# Patient Record
Sex: Female | Born: 2000 | Race: Black or African American | Hispanic: No | Marital: Single | Smoking: Former smoker
Health system: Southern US, Community
[De-identification: ages and names within clinical notes are randomized; demographics above are authoritative.]

## PROBLEM LIST (undated history)

## (undated) DIAGNOSIS — M329 Systemic lupus erythematosus, unspecified: Secondary | ICD-10-CM

## (undated) DIAGNOSIS — R110 Nausea: Secondary | ICD-10-CM

## (undated) DIAGNOSIS — IMO0002 Reserved for concepts with insufficient information to code with codable children: Secondary | ICD-10-CM

## (undated) DIAGNOSIS — M359 Systemic involvement of connective tissue, unspecified: Secondary | ICD-10-CM

## (undated) DIAGNOSIS — N289 Disorder of kidney and ureter, unspecified: Secondary | ICD-10-CM

## (undated) DIAGNOSIS — I10 Essential (primary) hypertension: Secondary | ICD-10-CM

## (undated) HISTORY — DX: Disorder of kidney and ureter, unspecified: N28.9

## (undated) HISTORY — DX: Reserved for concepts with insufficient information to code with codable children: IMO0002

## (undated) HISTORY — PX: RENAL BIOPSY: SHX156

## (undated) HISTORY — DX: Nausea: R11.0

## (undated) HISTORY — DX: Systemic lupus erythematosus, unspecified: M32.9

---
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
## 2022-02-03 ENCOUNTER — Other Ambulatory Visit: Payer: Self-pay

## 2022-02-03 ENCOUNTER — Emergency Department (HOSPITAL_BASED_OUTPATIENT_CLINIC_OR_DEPARTMENT_OTHER): Payer: 59 | Admitting: Radiology

## 2022-02-03 ENCOUNTER — Emergency Department (HOSPITAL_BASED_OUTPATIENT_CLINIC_OR_DEPARTMENT_OTHER): Payer: 59

## 2022-02-03 ENCOUNTER — Emergency Department (HOSPITAL_BASED_OUTPATIENT_CLINIC_OR_DEPARTMENT_OTHER)
Admission: EM | Admit: 2022-02-03 | Discharge: 2022-02-03 | Disposition: A | Discharge status: Home / Self Care | Payer: 59 | Attending: Emergency Medicine | Admitting: Emergency Medicine

## 2022-02-03 ENCOUNTER — Encounter (HOSPITAL_BASED_OUTPATIENT_CLINIC_OR_DEPARTMENT_OTHER): Payer: Self-pay

## 2022-02-03 DIAGNOSIS — R809 Proteinuria, unspecified: Secondary | ICD-10-CM | POA: Insufficient documentation

## 2022-02-03 DIAGNOSIS — I10 Essential (primary) hypertension: Secondary | ICD-10-CM | POA: Diagnosis not present

## 2022-02-03 DIAGNOSIS — R2243 Localized swelling, mass and lump, lower limb, bilateral: Secondary | ICD-10-CM | POA: Diagnosis not present

## 2022-02-03 DIAGNOSIS — R748 Abnormal levels of other serum enzymes: Secondary | ICD-10-CM

## 2022-02-03 DIAGNOSIS — Z79899 Other long term (current) drug therapy: Secondary | ICD-10-CM | POA: Insufficient documentation

## 2022-02-03 DIAGNOSIS — M7989 Other specified soft tissue disorders: Secondary | ICD-10-CM

## 2022-02-03 LAB — COMPREHENSIVE METABOLIC PANEL
ALT: <5 U/L (ref 0–44)
AST: 14 U/L — ABNORMAL LOW (ref 15–41)
Albumin: 2.5 g/dL — ABNORMAL LOW (ref 3.5–5.0)
Alkaline Phosphatase: 45 U/L (ref 38–126)
Anion gap: 7 (ref 5–15)
BUN: 24 mg/dL — ABNORMAL HIGH (ref 6–20)
CO2: 25 mmol/L (ref 22–32)
Calcium: 8.2 mg/dL — ABNORMAL LOW (ref 8.9–10.3)
Chloride: 108 mmol/L (ref 98–111)
Creatinine, Ser: 1.46 mg/dL — ABNORMAL HIGH (ref 0.44–1.00)
GFR, Estimated: 53 mL/min — ABNORMAL LOW (ref >60)
Glucose, Bld: 80 mg/dL (ref 70–99)
Potassium: 4.5 mmol/L (ref 3.5–5.1)
Sodium: 140 mmol/L (ref 135–145)
Total Bilirubin: 0.1 mg/dL — ABNORMAL LOW (ref 0.3–1.2)
Total Protein: 6.5 g/dL (ref 6.5–8.1)

## 2022-02-03 LAB — CBC WITH DIFFERENTIAL/PLATELET
Abs Immature Granulocytes: 0.03 10*3/uL (ref 0.00–0.07)
Basophils Absolute: 0 10*3/uL (ref 0.0–0.1)
Basophils Relative: 0 %
Eosinophils Absolute: 0.3 10*3/uL (ref 0.0–0.5)
Eosinophils Relative: 5 %
HCT: 27.9 % — ABNORMAL LOW (ref 36.0–46.0)
Hemoglobin: 8.8 g/dL — ABNORMAL LOW (ref 12.0–15.0)
Immature Granulocytes: 1 %
Lymphocytes Relative: 30 %
Lymphs Abs: 1.8 10*3/uL (ref 0.7–4.0)
MCH: 27 pg (ref 26.0–34.0)
MCHC: 31.5 g/dL (ref 30.0–36.0)
MCV: 85.6 fL (ref 80.0–100.0)
Monocytes Absolute: 0.4 10*3/uL (ref 0.1–1.0)
Monocytes Relative: 6 %
Neutro Abs: 3.5 10*3/uL (ref 1.7–7.7)
Neutrophils Relative %: 58 %
Platelets: 275 10*3/uL (ref 150–400)
RBC: 3.26 MIL/uL — ABNORMAL LOW (ref 3.87–5.11)
RDW: 13.4 % (ref 11.5–15.5)
WBC: 6 10*3/uL (ref 4.0–10.5)
nRBC: 0 % (ref 0.0–0.2)

## 2022-02-03 LAB — PREGNANCY, URINE: Preg Test, Ur: NEGATIVE

## 2022-02-03 MED ORDER — FUROSEMIDE 20 MG PO TABS: 20.0000 mg | ORAL_TABLET | Freq: Every day | ORAL | 0 refills | Status: DC

## 2022-02-03 MED ORDER — AMLODIPINE BESYLATE 5 MG PO TABS
10.0000 mg | ORAL_TABLET | Freq: Every day | ORAL | Status: DC
  Administered 2022-02-03: 10 mg via ORAL
  Filled 2022-02-03: qty 2

## 2022-02-03 MED ORDER — FUROSEMIDE 20 MG PO TABS
20.0000 mg | ORAL_TABLET | Freq: Once | ORAL | Status: AC
  Administered 2022-02-03: 20 mg via ORAL
  Filled 2022-02-03: qty 1

## 2022-02-03 MED ORDER — AMLODIPINE BESYLATE 10 MG PO TABS: 10.0000 mg | ORAL_TABLET | Freq: Every day | ORAL | 0 refills | Status: DC

## 2022-02-03 NOTE — ED Provider Notes (Signed)
[facility] EMERGENCY DEPT Provider Note   CSN: [id number] Arrival date & time: 02/03/22  1841     History  Chief Complaint  Patient presents with   Hypertension    Alexis Rice is a 21 y.o. female.  Patient here with leg swelling, high blood pressure.  Has had leg swelling for the past week.  Has been seen by doctor at her college.  She was noted to have leg swelling, protein in her urine and high blood pressure.  They were concerned and sent her for work-up.  She has not had any recent illnesses.  No significant medical history.  Did have a COVID booster several weeks ago.  Denies any shortness of breath or chest pain.  No vision changes.  Has not noticed any blood in her urine.  The history is provided by the patient.  Hypertension This is a new problem. The problem occurs constantly. The problem has not changed since onset.Pertinent negatives include no chest pain, no abdominal pain, no headaches and no shortness of breath. Nothing aggravates the symptoms. Nothing relieves the symptoms. She has tried nothing for the symptoms. The treatment provided no relief.      Home Medications Prior to Admission medications   Medication Sig Start Date End Date Taking? Authorizing Provider  amLODipine (NORVASC) 10 MG tablet Take 1 tablet (10 mg total) by mouth daily. 02/03/22 03/05/22 Yes Gabrielly Mccrystal, DO  furosemide (LASIX) 20 MG tablet Take 1 tablet (20 mg total) by mouth daily. 02/03/22 03/05/22 Yes Doshie Maggi, DO  ibuprofen (ADVIL,MOTRIN) 100 MG/5ML suspension Take 200 mg by mouth every 6 (six) hours as needed for fever.    [provider]  omeprazole (PRILOSEC) 20 MG capsule Take 20 mg by mouth daily.    [provider]  ondansetron (ZOFRAN) 4 MG/5ML solution Take 5 mLs (4 mg total) by mouth once. 08/18/13   Azalia Bilis, MD      Allergies    Patient has no known allergies.    Review of Systems   Review of Systems  Respiratory:  Negative for  shortness of breath.   Cardiovascular:  Negative for chest pain.  Gastrointestinal:  Negative for abdominal pain.  Neurological:  Negative for headaches.   Physical Exam Updated Vital Signs BP (!) 159/107    Pulse 100    Temp 98.3 F (36.8 C)    Resp 18    Ht 5' 3.5" (1.613 m)    Wt 44.5 kg    LMP 01/24/2022 (Exact Date)    SpO2 100%    BMI 17.11 kg/m  Physical Exam Vitals and nursing note reviewed.  Constitutional:      General: She is not in acute distress.    Appearance: She is well-developed.  HENT:     Head: Normocephalic and atraumatic.  Eyes:     Extraocular Movements: Extraocular movements intact.     Conjunctiva/sclera: Conjunctivae normal.     Pupils: Pupils are equal, round, and reactive to light.     Comments: Mild periorbital swelling of the eyes bilaterally  Cardiovascular:     Rate and Rhythm: Normal rate and regular rhythm.     Pulses: Normal pulses.     Heart sounds: Normal heart sounds. No murmur heard. Pulmonary:     Effort: Pulmonary effort is normal. No respiratory distress.     Breath sounds: Normal breath sounds.  Abdominal:     Palpations: Abdomen is soft.     Tenderness: There is no abdominal tenderness.  Musculoskeletal:        General: No swelling.     Cervical back: Neck supple.     Right lower leg: Edema present.     Left lower leg: Edema present.     Comments: 1+ pitting edema bilaterally  Skin:    General: Skin is warm and dry.     Capillary Refill: Capillary refill takes less than 2 seconds.  Neurological:     General: No focal deficit present.     Mental Status: She is alert and oriented to person, place, and time.     Cranial Nerves: No cranial nerve deficit.     Sensory: No sensory deficit.     Motor: No weakness.     Coordination: Coordination normal.  Psychiatric:        Mood and Affect: Mood normal.    ED Results / Procedures / Treatments   Labs (all labs ordered are listed, but only abnormal results are displayed) Labs  Reviewed  CBC WITH DIFFERENTIAL/PLATELET - Abnormal; Notable for the following components:      Result Value   RBC 3.26 (*)    Hemoglobin 8.8 (*)    HCT 27.9 (*)    All other components within normal limits  COMPREHENSIVE METABOLIC PANEL - Abnormal; Notable for the following components:   BUN 24 (*)    Creatinine, Ser 1.46 (*)    Calcium 8.2 (*)    Albumin 2.5 (*)    AST 14 (*)    Total Bilirubin 0.1 (*)    GFR, Estimated 53 (*)    All other components within normal limits  PREGNANCY, URINE    EKG EKG Interpretation  Date/Time:  Monday February 03 2022 19:07:29 EST Ventricular Rate:  91 PR Interval:  120 QRS Duration: 76 QT Interval:  338 QTC Calculation: 415 R Axis:   87 Text Interpretation: Normal sinus rhythm No previous ECGs available Confirmed by Lennice Sites ([id number]) on 02/03/2022 7:35:56 PM  Radiology DG Chest 2 View  Result Date: 02/03/2022 CLINICAL DATA:  Lower extremity swelling, hypertension EXAM: CHEST - 2 VIEW COMPARISON:  09/16/2013. FINDINGS: Cardiac and mediastinal contours are within normal limits. No focal pulmonary opacity. No pleural effusion or pneumothorax. No acute osseous abnormality. IMPRESSION: No acute cardiopulmonary process. Electronically Signed   By: Merilyn Baba M.D.   On: 02/03/2022 19:36   US Renal  Result Date: 02/03/2022 CLINICAL DATA:  Increased BUN and creatinine. Decreased gfr. Hypertension. Bilateral leg swelling. EXAM: RENAL / URINARY TRACT ULTRASOUND COMPLETE COMPARISON:  None. FINDINGS: Right Kidney: Renal measurements: 11 x 4.6 x 5.8 cm = volume: 139 mL. Echogenicity within normal limits. No mass or hydronephrosis visualized. Left Kidney: Renal measurements: 11.6 x 5.1 x 3.9 cm = volume: 117 mL. Echogenicity within normal limits. No mass or hydronephrosis visualized. Urinary bladder: Appears normal for degree of bladder distention. Other: None. IMPRESSION: Unremarkable renal ultrasound. Electronically Signed   By: Iven Finn M.D.    On: 02/03/2022 21:24    Procedures Procedures    Medications Ordered in ED Medications  amLODipine (NORVASC) tablet 10 mg (10 mg Oral Given 02/03/22 2101)  furosemide (LASIX) tablet 20 mg (20 mg Oral Given 02/03/22 2058)    ED Course/ Medical Decision Making/ A&P                           Medical Decision Making Amount and/or Complexity of Data Reviewed Labs: ordered. Radiology: ordered.  Risk Prescription drug management.  Sondra Blumer is here with high blood pressure, leg swelling.  Patient arrives with blood pressure 160s over 100.  Otherwise normal vitals.  Talked with provider at her college about her leg swelling and proteinuria.  They were concerned because she is having high blood pressure, leg swelling.  Concern for renal process.  She had urinalysis upon my review of outside labs that showed granular casts and large amount of proteins.  No evidence of infection.  She is not having any abdominal pain.  Leg swelling for the last week.  Blood pressure has been elevated now as well.  No recent illnesses.  Does endorse recent booster shot for COVID but otherwise has felt normal.  She thinks that the swelling in her legs is actually improved.  She does have some mild edema bilaterally in her legs as well as some periorbital swelling around her eyes.Differential diagnosis includes nephrotic/nephritic syndrome.  Less likely that this is an obstructive process from a kidney stone or dehydration.  CBC, CMP, pregnancy test has been ordered.  Lab work has been reviewed and interpreted by myself.  Creatinine elevated to 1.46.  GFR is 53.  Albumin is 2.5.  Pregnancy test is negative.  No significant leukocytosis.  Hemoglobin is 8.8.  Talked with Dr. Hollie Salk with nephrology on the phone as I am concerned for nephrotic syndrome.  We will get an ultrasound of her kidneys and patient will follow-up closely with Dr. Hollie Salk for continued work-up.  We will start her on 20 mg of Lasix daily as well as 10 mg  of amlodipine daily.  Medication to be started here.  Per review of radiology report renal ultrasound is unremarkable.  Patient to be started on Lasix and amlodipine.  We will follow-up with nephrology.  This chart was dictated using voice recognition software.  Despite best efforts to proofread,  errors can occur which can change the documentation meaning.         Final Clinical Impression(s) / ED Diagnoses Final diagnoses:  Elevated creatine kinase  Hypertension, unspecified type  Leg swelling    Rx / DC Orders ED Discharge Orders          Ordered    amLODipine (NORVASC) 10 MG tablet  Daily        02/03/22 2046    furosemide (LASIX) 20 MG tablet  Daily        02/03/22 2046              Lennice Sites, DO 02/03/22 2135

## 2022-02-03 NOTE — ED Triage Notes (Signed)
Patient here POV from Home with Parents.  Patient states she has noted Lower Extremity Swelling bilaterally for the past 3-4 days. Patient went to [facility] today and was found to be Hypertensive at 180 systolic.   UA on Campus noted protein in Same.  Headache this AM. No Fevers. No N/V/D.   NAD noted during Triage. A&Ox4. GCS 15. Ambulatory

## 2022-02-04 ENCOUNTER — Emergency Department (HOSPITAL_BASED_OUTPATIENT_CLINIC_OR_DEPARTMENT_OTHER)
Admission: EM | Admit: 2022-02-04 | Discharge: 2022-02-04 | Disposition: A | Discharge status: Home / Self Care | Payer: 59 | Attending: Emergency Medicine | Admitting: Emergency Medicine

## 2022-02-04 ENCOUNTER — Encounter (HOSPITAL_BASED_OUTPATIENT_CLINIC_OR_DEPARTMENT_OTHER): Payer: Self-pay

## 2022-02-04 ENCOUNTER — Other Ambulatory Visit: Payer: Self-pay

## 2022-02-04 DIAGNOSIS — D649 Anemia, unspecified: Secondary | ICD-10-CM | POA: Diagnosis not present

## 2022-02-04 DIAGNOSIS — R519 Headache, unspecified: Secondary | ICD-10-CM | POA: Diagnosis present

## 2022-02-04 DIAGNOSIS — Z79899 Other long term (current) drug therapy: Secondary | ICD-10-CM | POA: Diagnosis not present

## 2022-02-04 DIAGNOSIS — I10 Essential (primary) hypertension: Secondary | ICD-10-CM | POA: Insufficient documentation

## 2022-02-04 DIAGNOSIS — G44209 Tension-type headache, unspecified, not intractable: Secondary | ICD-10-CM

## 2022-02-04 LAB — CBC WITH DIFFERENTIAL/PLATELET
Abs Immature Granulocytes: 0.02 10*3/uL (ref 0.00–0.07)
Basophils Absolute: 0 10*3/uL (ref 0.0–0.1)
Basophils Relative: 0 %
Eosinophils Absolute: 0.2 10*3/uL (ref 0.0–0.5)
Eosinophils Relative: 2 %
HCT: 28.3 % — ABNORMAL LOW (ref 36.0–46.0)
Hemoglobin: 9.2 g/dL — ABNORMAL LOW (ref 12.0–15.0)
Immature Granulocytes: 0 %
Lymphocytes Relative: 15 %
Lymphs Abs: 1.3 10*3/uL (ref 0.7–4.0)
MCH: 27.5 pg (ref 26.0–34.0)
MCHC: 32.5 g/dL (ref 30.0–36.0)
MCV: 84.5 fL (ref 80.0–100.0)
Monocytes Absolute: 0.4 10*3/uL (ref 0.1–1.0)
Monocytes Relative: 4 %
Neutro Abs: 6.5 10*3/uL (ref 1.7–7.7)
Neutrophils Relative %: 79 %
Platelets: 232 10*3/uL (ref 150–400)
RBC: 3.35 MIL/uL — ABNORMAL LOW (ref 3.87–5.11)
RDW: 13.2 % (ref 11.5–15.5)
WBC: 8.3 10*3/uL (ref 4.0–10.5)
nRBC: 0 % (ref 0.0–0.2)

## 2022-02-04 LAB — BASIC METABOLIC PANEL
Anion gap: 6 (ref 5–15)
BUN: 23 mg/dL — ABNORMAL HIGH (ref 6–20)
CO2: 25 mmol/L (ref 22–32)
Calcium: 7.9 mg/dL — ABNORMAL LOW (ref 8.9–10.3)
Chloride: 108 mmol/L (ref 98–111)
Creatinine, Ser: 1.02 mg/dL — ABNORMAL HIGH (ref 0.44–1.00)
GFR, Estimated: >60 mL/min (ref >60)
Glucose, Bld: 93 mg/dL (ref 70–99)
Potassium: 4.1 mmol/L (ref 3.5–5.1)
Sodium: 139 mmol/L (ref 135–145)

## 2022-02-04 MED ORDER — ACETAMINOPHEN 500 MG PO TABS
1000.0000 mg | ORAL_TABLET | Freq: Once | ORAL | Status: AC
Start: 2022-02-04 — End: 2022-02-04
  Administered 2022-02-04: 1000 mg via ORAL
  Filled 2022-02-04: qty 2

## 2022-02-04 MED ORDER — SODIUM CHLORIDE 0.9 % IV BOLUS
1000.0000 mL | Freq: Once | INTRAVENOUS | Status: AC
  Administered 2022-02-04: 1000 mL via INTRAVENOUS

## 2022-02-04 MED ORDER — KETOROLAC TROMETHAMINE 15 MG/ML IJ SOLN
15.0000 mg | Freq: Once | INTRAMUSCULAR | Status: AC
  Administered 2022-02-04: 15 mg via INTRAVENOUS
  Filled 2022-02-04: qty 1

## 2022-02-04 MED ORDER — DIPHENHYDRAMINE HCL 50 MG/ML IJ SOLN
25.0000 mg | Freq: Once | INTRAMUSCULAR | Status: AC
  Administered 2022-02-04: 25 mg via INTRAVENOUS
  Filled 2022-02-04: qty 1

## 2022-02-04 MED ORDER — SODIUM CHLORIDE 0.9 % IV SOLN: INTRAVENOUS | Status: DC

## 2022-02-04 MED ORDER — METOCLOPRAMIDE HCL 5 MG/ML IJ SOLN
10.0000 mg | Freq: Once | INTRAMUSCULAR | Status: AC
  Administered 2022-02-04: 10 mg via INTRAVENOUS
  Filled 2022-02-04: qty 2

## 2022-02-04 NOTE — Discharge Instructions (Addendum)
You were evaluated in the Emergency Department and after careful evaluation, we did not find any emergent condition requiring admission or further testing in the hospital.  Your exam/testing today was overall reassuring.  Your kidney function was improving on laboratory evaluation today with a creatinine of 1.02 which is improved from 1.46 yesterday.  You do have an anemia on laboratory evaluation with a hemoglobin of 9.2 which is improved from testing at 8.8 yesterday.  This could be further worked up and monitored in an outpatient setting.  Please return to the Emergency Department if you experience any worsening of your condition.  Thank you for allowing [place] to be a part of your care.

## 2022-02-04 NOTE — ED Provider Notes (Signed)
[facility] EMERGENCY DEPT Provider Note   CSN: [id number] Arrival date & time: 02/04/22  [id number]     History  Chief Complaint  Patient presents with   Headache    With vomiting    Alexis Rice is a 21 y.o. female.   Headache Pain location:  Frontal Quality:  Dull Radiates to:  Does not radiate Severity currently:  4/10 Severity at highest:  10/10 Onset quality:  Gradual Duration:  2 days Timing:  Intermittent Progression:  Unchanged  20 year old female presenting to the emergency department with a headache and 3 episodes of NBNB emesis.  The patient had a headache yesterday for which she took ibuprofen.  Subsequently resolved.  She redeveloped a headache this morning.  It is currently 4 out of 10 in intensity located in a bandlike pattern across her forehead.  She denies any fevers or chills or neck stiffness.  She was seen yesterday in the emergency department due to elevated blood pressure and was found to be hypertensive with proteinuria, 1+ pitting edema bilaterally and mild periorbital swelling of the eyes.  She is found to have an elevated serum creatinine concerning for nephrotic syndrome she was started on amlodipine and Lasix and advised to follow-up with nephrology outpatient.  She has scheduled follow-up later this week.  She had a renal ultrasound which was unremarkable yesterday.  She denies any vision changes or visual field deficits.  Home Medications Prior to Admission medications   Medication Sig Start Date End Date Taking? Authorizing Provider  amLODipine (NORVASC) 10 MG tablet Take 1 tablet (10 mg total) by mouth daily. 02/03/22 03/05/22  Curatolo, Adam, DO  furosemide (LASIX) 20 MG tablet Take 1 tablet (20 mg total) by mouth daily. 02/03/22 03/05/22  Curatolo, Adam, DO  ibuprofen (ADVIL,MOTRIN) 100 MG/5ML suspension Take 200 mg by mouth every 6 (six) hours as needed for fever.    [provider]  omeprazole (PRILOSEC) 20 MG capsule Take 20 mg  by mouth daily.    [provider]  ondansetron (ZOFRAN) 4 MG/5ML solution Take 5 mLs (4 mg total) by mouth once. 08/18/13   Jola Schmidt, MD      Allergies    Patient has no known allergies.    Review of Systems   Review of Systems  Neurological:  Positive for headaches.  All other systems reviewed and are negative.  Physical Exam Updated Vital Signs BP 124/86    Pulse 98    Temp 98.1 F (36.7 C)    Resp 16    Ht 5' 3.5" (1.613 m)    Wt 44.5 kg    LMP 01/24/2022 (Exact Date)    SpO2 100%    BMI 17.11 kg/m  Physical Exam Vitals and nursing note reviewed.  Constitutional:      General: She is not in acute distress.    Appearance: She is well-developed.  HENT:     Head: Normocephalic and atraumatic.  Eyes:     Conjunctiva/sclera: Conjunctivae normal.  Cardiovascular:     Rate and Rhythm: Normal rate and regular rhythm.     Heart sounds: No murmur heard. Pulmonary:     Effort: Pulmonary effort is normal. No respiratory distress.     Breath sounds: Normal breath sounds.  Abdominal:     Palpations: Abdomen is soft.     Tenderness: There is no abdominal tenderness.  Musculoskeletal:        General: No swelling.     Cervical back: Neck supple.  Skin:  General: Skin is warm and dry.     Capillary Refill: Capillary refill takes less than 2 seconds.  Neurological:     Mental Status: She is alert.     GCS: GCS eye subscore is 4. GCS verbal subscore is 5. GCS motor subscore is 6.     Cranial Nerves: Cranial nerves 2-12 are intact.     Comments: MENTAL STATUS EXAM:    Orientation: Alert and oriented to person, place and time.  Memory: Cooperative, follows commands well.  Language: Speech is clear and language is normal.   CRANIAL NERVES:    CN 2 (Optic): Visual fields intact to confrontation.  CN 3,4,6 (EOM): Pupils equal and reactive to light. Full extraocular eye movement without nystagmus.  CN 5 (Trigeminal): Facial sensation is normal, no weakness of masticatory  muscles.  CN 7 (Facial): No facial weakness or asymmetry.  CN 8 (Auditory): Auditory acuity grossly normal.  CN 9,10 (Glossophar): The uvula is midline, the palate elevates symmetrically.  CN 11 (spinal access): Normal sternocleidomastoid and trapezius strength.  CN 12 (Hypoglossal): The tongue is midline. No atrophy or fasciculations.Marland Kitchen.   MOTOR:  Muscle Strength: 5/5RUE, 5/5LUE, 5/5RLE, 5/5LLE.   COORDINATION:   Intact finger-to-nose, no tremor.   SENSATION:   Intact to light touch all four extremities.  GAIT: Gait normal without ataxia   Psychiatric:        Mood and Affect: Mood normal.    ED Results / Procedures / Treatments   Labs (all labs ordered are listed, but only abnormal results are displayed) Labs Reviewed  CBC WITH DIFFERENTIAL/PLATELET - Abnormal; Notable for the following components:      Result Value   RBC 3.35 (*)    Hemoglobin 9.2 (*)    HCT 28.3 (*)    All other components within normal limits  BASIC METABOLIC PANEL - Abnormal; Notable for the following components:   BUN 23 (*)    Creatinine, Ser 1.02 (*)    Calcium 7.9 (*)    All other components within normal limits    EKG None  Radiology DG Chest 2 View  Result Date: 02/03/2022 CLINICAL DATA:  Lower extremity swelling, hypertension EXAM: CHEST - 2 VIEW COMPARISON:  09/16/2013. FINDINGS: Cardiac and mediastinal contours are within normal limits. No focal pulmonary opacity. No pleural effusion or pneumothorax. No acute osseous abnormality. IMPRESSION: No acute cardiopulmonary process. Electronically Signed   By: Wiliam KeAlison  Vasan M.D.   On: 02/03/2022 19:36   US Renal  Result Date: 02/03/2022 CLINICAL DATA:  Increased BUN and creatinine. Decreased gfr. Hypertension. Bilateral leg swelling. EXAM: RENAL / URINARY TRACT ULTRASOUND COMPLETE COMPARISON:  None. FINDINGS: Right Kidney: Renal measurements: 11 x 4.6 x 5.8 cm = volume: 139 mL. Echogenicity within normal limits. No mass or hydronephrosis visualized.  Left Kidney: Renal measurements: 11.6 x 5.1 x 3.9 cm = volume: 117 mL. Echogenicity within normal limits. No mass or hydronephrosis visualized. Urinary bladder: Appears normal for degree of bladder distention. Other: None. IMPRESSION: Unremarkable renal ultrasound. Electronically Signed   By: Tish FredericksonMorgane  Naveau M.D.   On: 02/03/2022 21:24    Procedures Procedures    Medications Ordered in ED Medications  sodium chloride 0.9 % bolus 1,000 mL (0 mLs Intravenous Stopped 02/04/22 0904)  ketorolac (TORADOL) 15 MG/ML injection 15 mg (15 mg Intravenous Given 02/04/22 0758)  metoCLOPramide (REGLAN) injection 10 mg (10 mg Intravenous Given 02/04/22 0800)  diphenhydrAMINE (BENADRYL) injection 25 mg (25 mg Intravenous Given 02/04/22 0757)  acetaminophen (TYLENOL) tablet 1,000  mg (1,000 mg Oral Given 02/04/22 [id number])    ED Course/ Medical Decision Making/ A&P                           Medical Decision Making Amount and/or Complexity of Data Reviewed Labs: ordered.  Risk OTC drugs. Prescription drug management.    21 year old female presenting to the emergency department with a headache and 3 episodes of NBNB emesis.  The patient had a headache yesterday for which she took ibuprofen.  Subsequently resolved.  She redeveloped a headache this morning.  It is currently 4 out of 10 in intensity located in a bandlike pattern across her forehead.  She denies any fevers or chills or neck stiffness.  She was seen yesterday in the emergency department due to elevated blood pressure and was found to be hypertensive with proteinuria, 1+ pitting edema bilaterally and mild periorbital swelling of the eyes.  She is found to have an elevated serum creatinine concerning for nephrotic syndrome she was started on amlodipine and Lasix and advised to follow-up with nephrology outpatient.  She has scheduled follow-up later this week.  She had a renal ultrasound which was unremarkable yesterday.  She denies any vision changes or  visual field deficits.  On arrival, the patient was afebrile, temperature 98.1, mildly tachycardic P102, mildly hypertensive BP 141/102, not tachypneic, saturating 99% on room air.  Sinus tachycardia noted on cardiac telemetry.  The patient presents with roughly 2 days of intermittent tension type headache.  She has no meningismus on exam.  She is not febrile.  Low concern for meningitis or encephalitis at this time.  She has a normal neurologic exam.  I do not think CT imaging of the head is warranted at this time.  Currently, she is awake, alert, GCS 15, HDS, and afebrile. Her exam is most notable for normal gait, fully intact extraocular motions with bilaterally reactive pupils, no focal neurologic deficits, no meningismus, and no temporal tenderness. There is no rash. The headache was not sudden onset or the worst headache of the patient's life. There is no visual deficit.  I am most concerned for tension type headache.  To further evaluate and risk stratify her, labs and imaging were obtained, which were significant for:  Labs: CBC without a leukocytosis, stable anemia with improvement to 9.2, BMP with improvement in the patient's serum creatinine to 1.02.   I do not think the patient has an aneurysm, intracranial bleed, mass lesion, meningitis, temporal arteritis, stroke, cluster headache, idiopathic intracranial hypertension, cavernous sinus thrombosis, carbon monoxide toxicity, herpes zoster, carotid or vertebral artery dissection, or acute angle close glaucoma.  On reassessment, the patient remained well appearing and was again able to ambulate without difficulty and did not have any focal neurologic deficits.  I believe the patient is stable for discharge.  We participated in shared decision making regarding continued observation in the ED versus discharge home for continued recovery after receiving the below medications.  She preferred to recover at home. I believe that this is safe and  reasonable.  We have discussed the diagnosis and risks, and we agree with discharging home to follow-up with their primary doctor. We also discussed returning to the Emergency Department immediately if new or worsening symptoms occur. We have discussed the symptoms which are most concerning (e.g., changing or worsening pain, weakness, vomiting, fever, or abnormal sensation) that necessitate immediate return. I provided ED return precautions. The patient felt safe with this plan.  ED Medication Summary: Medications  sodium chloride 0.9 % bolus 1,000 mL (0 mLs Intravenous Stopped 02/04/22 0904)  ketorolac (TORADOL) 15 MG/ML injection 15 mg (15 mg Intravenous Given 02/04/22 0758)  metoCLOPramide (REGLAN) injection 10 mg (10 mg Intravenous Given 02/04/22 0800)  diphenhydrAMINE (BENADRYL) injection 25 mg (25 mg Intravenous Given 02/04/22 0757)  acetaminophen (TYLENOL) tablet 1,000 mg (1,000 mg Oral Given 02/04/22 0826)    Final Clinical Impression(s) / ED Diagnoses Final diagnoses:  Acute non intractable tension-type headache  Anemia, unspecified type    Rx / DC Orders ED Discharge Orders     None         Regan Lemming, MD 02/04/22 2206

## 2022-02-04 NOTE — ED Triage Notes (Signed)
Seen here yesterday for high blood pressure and vomiting.  Last night redeveloped headache and vomited twice.

## 2022-02-11 ENCOUNTER — Other Ambulatory Visit (HOSPITAL_COMMUNITY): Payer: Self-pay | Admitting: Nephrology

## 2022-02-11 ENCOUNTER — Other Ambulatory Visit: Payer: Self-pay | Admitting: Nephrology

## 2022-02-11 DIAGNOSIS — N183 Chronic kidney disease, stage 3 unspecified: Secondary | ICD-10-CM

## 2022-02-12 ENCOUNTER — Other Ambulatory Visit (HOSPITAL_COMMUNITY): Payer: Self-pay | Admitting: Nephrology

## 2022-02-12 DIAGNOSIS — N183 Chronic kidney disease, stage 3 unspecified: Secondary | ICD-10-CM

## 2022-02-12 DIAGNOSIS — N179 Acute kidney failure, unspecified: Secondary | ICD-10-CM

## 2022-02-19 ENCOUNTER — Other Ambulatory Visit: Payer: Self-pay | Admitting: Radiology

## 2022-02-19 NOTE — H&P (Signed)
? ?Chief Complaint: ?Patient was seen in consultation today for image guided random renal bx at the request of Singh,Vikas ? ?Referring Physician(s): Singh,Vikas ? ?Supervising Physician: Mir, Sharen Heck ? ?Patient Status: [facility] - Out-pt ? ?History of Present Illness: ?Alexis Rice is a 21 y.o. female who does not have PMH per chart.  ?Patient presented to ED on 02/03/22 due to HA and N/V, found to have  hypertensive with proteinuria, 1+ pitting edema bilaterally and mild periorbital swelling of the eyes.  She was found to have an elevated serum creatinine concerning for nephrotic syndrome she was started on amlodipine and Lasix and advised to follow-up with nephrology outpatient.  ?Patient has established care with Dr. Candiss Norse from nephrology, who recommended random renal biopsy for further evaluation.  ? ?IR was recommended for image guided random renal biopsy.  ? ?Patient laying in bed, not in acute distress.  ?Denise headache, fever, chills, shortness of breath, cough, chest pain, abdominal pain, nausea ,vomiting, and bleeding. ? ?Past Medical History:  ?Diagnosis Date  ? Nausea   ? Reduced Appetite  ? ? ?History reviewed. No pertinent surgical history. ? ?Allergies: ?Patient has no known allergies. ? ?Medications: ?Prior to Admission medications   ?Medication Sig Start Date End Date Taking? Authorizing Provider  ?amLODipine (NORVASC) 10 MG tablet Take 1 tablet (10 mg total) by mouth daily. 02/03/22 03/05/22 Yes Curatolo, Adam, DO  ?ferrous sulfate 325 (65 FE) MG tablet Take 325 mg by mouth daily with breakfast.   Yes [provider]  ?FOLIC ACID PO Take 1 tablet by mouth daily.   Yes [provider]  ?torsemide (DEMADEX) 20 MG tablet Take 20 mg by mouth daily.   Yes [provider]  ?furosemide (LASIX) 20 MG tablet Take 1 tablet (20 mg total) by mouth daily. ?Patient not taking: Reported on 02/17/2022 02/03/22 03/05/22  Lennice Sites, DO  ?ondansetron [facility]) 4 MG/5ML solution Take 5 mLs (4 mg  total) by mouth once. ?Patient not taking: Reported on 02/17/2022 08/18/13   Jola Schmidt, MD  ?  ? ?Family History  ?Problem Relation Age of Onset  ? Ulcers Father   ? Asthma Sister   ? Cholelithiasis Maternal Aunt   ? ? ?Social History  ? ?Socioeconomic History  ? Marital status: Single  ?  Spouse name: Not on file  ? Number of children: Not on file  ? Years of education: Not on file  ? Highest education level: Not on file  ?Occupational History  ? Not on file  ?Tobacco Use  ? Smoking status: Former  ?  Types: Cigarettes  ? Smokeless tobacco: Not on file  ?Vaping Use  ? Vaping Use: Never used  ?Substance and Sexual Activity  ? Alcohol use: No  ? Drug use: Never  ? Sexual activity: Never  ?Other Topics Concern  ? Not on file  ?Social History Narrative  ? 7th grade 2014-2015  ? ?Social Determinants of Health  ? ?Financial Resource Strain: Not on file  ?Food Insecurity: Not on file  ?Transportation Needs: Not on file  ?Physical Activity: Not on file  ?Stress: Not on file  ?Social Connections: Not on file  ? ? ? ?Review of Systems: A 12 point ROS discussed and pertinent positives are indicated in the HPI above.  All other systems are negative. ? ?Vital Signs: ?BP (!) 131/95   Pulse (!) 101   Temp 98.5 ?F (36.9 ?C) (Oral)   Resp 16   Ht 5\' 5"  (1.651 m)   Wt  141 lb (64 kg)   LMP 02/18/2022 (Exact Date)   SpO2 100%   BMI 23.46 kg/m?  ? ? ?Physical Exam ?Vitals reviewed.  ?Constitutional:   ?   General: She is not in acute distress. ?   Appearance: Normal appearance. She is not ill-appearing.  ?HENT:  ?   Head: Normocephalic and atraumatic.  ?   Mouth/Throat:  ?   Mouth: Mucous membranes are moist.  ?Cardiovascular:  ?   Rate and Rhythm: Regular rhythm. Tachycardia present.  ?   Heart sounds: Normal heart sounds.  ?Pulmonary:  ?   Effort: Pulmonary effort is normal.  ?   Breath sounds: Normal breath sounds.  ?Abdominal:  ?   General: Abdomen is flat. Bowel sounds are normal.  ?   Palpations: Abdomen is soft.   ?Skin: ?   General: Skin is warm and dry.  ?   Coloration: Skin is not jaundiced or pale.  ?Neurological:  ?   Mental Status: She is alert and oriented to person, place, and time.  ?Psychiatric:     ?   Mood and Affect: Mood normal.     ?   Behavior: Behavior normal.     ?   Judgment: Judgment normal.  ? ? ?MD Evaluation ?Airway: WNL ?Heart: WNL ?Abdomen: WNL ?Chest/ Lungs: WNL ?ASA  Classification: 2 ?Mallampati/Airway Score: Two ? ?Imaging: ?DG Chest 2 View ? ?Result Date: 02/03/2022 ?CLINICAL DATA:  Lower extremity swelling, hypertension EXAM: CHEST - 2 VIEW COMPARISON:  09/16/2013. FINDINGS: Cardiac and mediastinal contours are within normal limits. No focal pulmonary opacity. No pleural effusion or pneumothorax. No acute osseous abnormality. IMPRESSION: No acute cardiopulmonary process. Electronically Signed   By: Merilyn Baba M.D.   On: 02/03/2022 19:36  ? ?US Renal ? ?Result Date: 02/03/2022 ?CLINICAL DATA:  Increased BUN and creatinine. Decreased gfr. Hypertension. Bilateral leg swelling. EXAM: RENAL / URINARY TRACT ULTRASOUND COMPLETE COMPARISON:  None. FINDINGS: Right Kidney: Renal measurements: 11 x 4.6 x 5.8 cm = volume: 139 mL. Echogenicity within normal limits. No mass or hydronephrosis visualized. Left Kidney: Renal measurements: 11.6 x 5.1 x 3.9 cm = volume: 117 mL. Echogenicity within normal limits. No mass or hydronephrosis visualized. Urinary bladder: Appears normal for degree of bladder distention. Other: None. IMPRESSION: Unremarkable renal ultrasound. Electronically Signed   By: Iven Finn M.D.   On: 02/03/2022 21:24   ? ?Labs: ? ?CBC: ?Recent Labs  ?  02/03/22 ?1901 02/04/22 ?0753 02/20/22 ?0630  ?WBC 6.0 8.3 6.1  ?HGB 8.8* 9.2* 8.6*  ?HCT 27.9* 28.3* 26.3*  ?PLT 275 232 256  ? ? ?COAGS: ?Recent Labs  ?  02/20/22 ?0630  ?INR 0.9  ? ? ?BMP: ?Recent Labs  ?  02/03/22 ?1901 02/04/22 ?0753  ?NA 140 139  ?K 4.5 4.1  ?CL 108 108  ?CO2 25 25  ?GLUCOSE 80 93  ?BUN 24* 23*  ?CALCIUM 8.2* 7.9*   ?CREATININE 1.46* 1.02*  ?GFRNONAA 53* >60  ? ? ?LIVER FUNCTION TESTS: ?Recent Labs  ?  02/03/22 ?1901  ?BILITOT 0.1*  ?AST 14*  ?ALT <5  ?ALKPHOS 45  ?PROT 6.5  ?ALBUMIN 2.5*  ? ? ?TUMOR MARKERS: ?No results for input(s): AFPTM, CEA, CA199, CHROMGRNA in the last 8760 hours. ? ?Assessment and Plan: ?21 y.o. 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  ? ?IR was requested for image guided random renal biopsy for further evaluation.  ? ?NPO since MN ?VSS  ?CBC stable  ?  INR 0.9 ?Not on Ac/AP  ? ?Risks and benefits of random renal biopsy was discussed with the patient and/or patient's family including, but not limited to bleeding, infection, damage to adjacent structures or low yield requiring additional tests. ? ?All of the questions were answered and there is agreement to proceed. ? ?Consent signed and in chart. ? ?Thank you for this interesting consult.  I greatly enjoyed meeting Akaia Gwyn and look forward to participating in their care.  A copy of this report was sent to the requesting provider on this date. ? ?Electronically Signed: ?Tera Mater, PA-C ?02/20/2022, 7:44 AM ? ? ?I spent a total of    25 Minutes in face to face in clinical consultation, greater than 50% of which was counseling/coordinating care for random renal bx.  ? ?This chart was dictated using voice recognition software.  Despite best efforts to proofread,  errors can occur which can change the documentation meaning.  ? ?

## 2022-02-20 ENCOUNTER — Encounter (HOSPITAL_COMMUNITY): Payer: Self-pay

## 2022-02-20 ENCOUNTER — Other Ambulatory Visit: Payer: Self-pay

## 2022-02-20 ENCOUNTER — Ambulatory Visit (HOSPITAL_COMMUNITY)
Admission: RE | Admit: 2022-02-20 | Discharge: 2022-02-20 | Disposition: A | Discharge status: Home / Self Care | Payer: 59 | Source: Ambulatory Visit | Attending: Nephrology | Admitting: Nephrology

## 2022-02-20 DIAGNOSIS — N183 Chronic kidney disease, stage 3 unspecified: Secondary | ICD-10-CM | POA: Insufficient documentation

## 2022-02-20 DIAGNOSIS — R809 Proteinuria, unspecified: Secondary | ICD-10-CM | POA: Insufficient documentation

## 2022-02-20 DIAGNOSIS — D631 Anemia in chronic kidney disease: Secondary | ICD-10-CM | POA: Insufficient documentation

## 2022-02-20 DIAGNOSIS — N179 Acute kidney failure, unspecified: Secondary | ICD-10-CM | POA: Insufficient documentation

## 2022-02-20 DIAGNOSIS — R3129 Other microscopic hematuria: Secondary | ICD-10-CM | POA: Diagnosis not present

## 2022-02-20 DIAGNOSIS — N2581 Secondary hyperparathyroidism of renal origin: Secondary | ICD-10-CM | POA: Diagnosis not present

## 2022-02-20 DIAGNOSIS — I129 Hypertensive chronic kidney disease with stage 1 through stage 4 chronic kidney disease, or unspecified chronic kidney disease: Secondary | ICD-10-CM | POA: Insufficient documentation

## 2022-02-20 DIAGNOSIS — R609 Edema, unspecified: Secondary | ICD-10-CM | POA: Diagnosis not present

## 2022-02-20 LAB — CBC
HCT: 26.3 % — ABNORMAL LOW (ref 36.0–46.0)
Hemoglobin: 8.6 g/dL — ABNORMAL LOW (ref 12.0–15.0)
MCH: 27.5 pg (ref 26.0–34.0)
MCHC: 32.7 g/dL (ref 30.0–36.0)
MCV: 84 fL (ref 80.0–100.0)
Platelets: 256 10*3/uL (ref 150–400)
RBC: 3.13 MIL/uL — ABNORMAL LOW (ref 3.87–5.11)
RDW: 14.3 % (ref 11.5–15.5)
WBC: 6.1 10*3/uL (ref 4.0–10.5)
nRBC: 0 % (ref 0.0–0.2)

## 2022-02-20 LAB — PREGNANCY, URINE: Preg Test, Ur: NEGATIVE

## 2022-02-20 LAB — PROTIME-INR
INR: 0.9 (ref 0.8–1.2)
Prothrombin Time: 12.3 seconds (ref 11.4–15.2)

## 2022-02-20 MED ORDER — HYDRALAZINE HCL 20 MG/ML IJ SOLN
INTRAMUSCULAR | Status: AC | PRN
  Administered 2022-02-20: 10 mg via INTRAVENOUS

## 2022-02-20 MED ORDER — MIDAZOLAM HCL 2 MG/2ML IJ SOLN
INTRAMUSCULAR | Status: AC | PRN
Start: 2022-02-20 — End: 2022-02-20
  Administered 2022-02-20: .5 mg via INTRAVENOUS
  Administered 2022-02-20: 1 mg via INTRAVENOUS

## 2022-02-20 MED ORDER — MIDAZOLAM HCL 2 MG/2ML IJ SOLN
INTRAMUSCULAR | Status: AC
  Filled 2022-02-20: qty 2

## 2022-02-20 MED ORDER — GELATIN ABSORBABLE 12-7 MM EX MISC
CUTANEOUS | Status: AC
  Filled 2022-02-20: qty 1

## 2022-02-20 MED ORDER — SODIUM CHLORIDE 0.9 % IV SOLN: INTRAVENOUS | Status: DC

## 2022-02-20 MED ORDER — AMLODIPINE BESYLATE 10 MG PO TABS
10.0000 mg | ORAL_TABLET | Freq: Every day | ORAL | Status: AC
  Administered 2022-02-20: 10 mg via ORAL
  Filled 2022-02-20: qty 1

## 2022-02-20 MED ORDER — HYDRALAZINE HCL 20 MG/ML IJ SOLN
INTRAMUSCULAR | Status: AC
  Filled 2022-02-20: qty 1

## 2022-02-20 MED ORDER — FENTANYL CITRATE (PF) 100 MCG/2ML IJ SOLN
INTRAMUSCULAR | Status: AC | PRN
  Administered 2022-02-20: 25 ug via INTRAVENOUS
  Administered 2022-02-20: 50 ug via INTRAVENOUS

## 2022-02-20 MED ORDER — LIDOCAINE HCL (PF) 1 % IJ SOLN
INTRAMUSCULAR | Status: AC
  Filled 2022-02-20: qty 30

## 2022-02-20 MED ORDER — FENTANYL CITRATE (PF) 100 MCG/2ML IJ SOLN
INTRAMUSCULAR | Status: AC
  Filled 2022-02-20: qty 2

## 2022-02-20 NOTE — Sedation Documentation (Signed)
Biopsy completed. Holding direct pressure to site for 5 min  ?

## 2022-02-20 NOTE — Procedures (Signed)
Interventional Radiology Procedure Note ? ?Procedure: US guided random renal biopsy ? ?Indication: Chronic Kidney Disease ? ?Findings: Please refer to procedural dictation for full description. ? ?Complications: None ? ?EBL: < 10 mL ? ?Miachel Roux, MD ?[phone] ? ? ?

## 2022-02-24 LAB — SURGICAL PATHOLOGY

## 2022-02-25 ENCOUNTER — Encounter (HOSPITAL_COMMUNITY): Payer: Self-pay

## 2022-02-25 DIAGNOSIS — I10 Essential (primary) hypertension: Secondary | ICD-10-CM | POA: Insufficient documentation

## 2022-03-04 ENCOUNTER — Ambulatory Visit (HOSPITAL_BASED_OUTPATIENT_CLINIC_OR_DEPARTMENT_OTHER): Payer: 59 | Admitting: Family Medicine

## 2022-03-20 NOTE — Progress Notes (Signed)
? ?Office Visit Note ? ?Patient: Alexis Rice             ?Date of Birth: 03/26/2001           ?MRN: [id number]             ?PCP: Pcp, No ?Referring: Bartholome Bill, MD ?Visit Date: 03/21/2022 ?Occupation: @GUAROCC @ ? ?Subjective:  ?Lupus nephritis and joint pain ? ?History of Present Illness: Alexis Rice is a 21 y.o. female seen in consultation per request of Dr. Luciana Axe.  According the patient in the first week of February she started experiencing tightness in her lower extremities which gradually became swelling.  [facility] where she was found to have elevated blood pressure.  She was referred to emergency room and was given antihypertensives.  She states that due to lower extremity swelling and hypertension she was referred to a nephrologist.  She was evaluated by Dr. Gean Quint at [facility].  He found that she had proteinuria 4.9 g/day, hematuria, albumin 2.5 and creatinine 1.5.  She underwent renal biopsy on February 20, 2022.  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  She was a started on prednisone 20 mg 3 times daily on March 9 by Dr. Candiss Norse.  She was also placed on hydroxychloroquine 200 mg p.o. daily and CellCept 500 mg p.o. twice daily.  Patient states that she has noticed resolution of malar rash.  She continues to have discomfort in her joints which she describes in her elbows and her knees.  She continues to have significant pedal edema which is improved to some extent.  She has been tolerating medications well.  She denies any history of fatigue,oral ulcers, nasal ulcers, sicca symptoms, Raynaud's phenomenon, lymphadenopathy, photosensitivity.  She has been using sunscreen.  Last Sunday after eating pancakes she started having diarrhea.  She states her symptoms are improving. ? ?Activities of Daily Living:  ?Patient reports morning stiffness for 3 hours.   ?Patient Reports nocturnal pain.  ?Difficulty  dressing/grooming: Denies ?Difficulty climbing stairs: Reports ?Difficulty getting out of chair: Denies ?Difficulty using hands for taps, buttons, cutlery, and/or writing: Denies ? ?Review of Systems  ?Constitutional:  Positive for fatigue.  ?HENT:  Positive for mouth dryness. Negative for mouth sores.   ?Eyes:  Negative for dryness.  ?Respiratory:  Negative for shortness of breath.   ?Cardiovascular:  Positive for swelling in legs/feet.  ?Gastrointestinal:  Positive for diarrhea.  ?Endocrine: Positive for increased urination.  ?Genitourinary:  Negative for difficulty urinating.  ?Musculoskeletal:  Positive for joint pain, joint pain, morning stiffness and muscle tenderness.  ?Skin:  Negative for color change, rash and sensitivity to sunlight.  ?Allergic/Immunologic: Negative for susceptible to infections.  ?Neurological:  Negative for numbness.  ?Hematological:  Negative for bruising/bleeding tendency and swollen glands.  ?Psychiatric/Behavioral:  Positive for depressed mood and sleep disturbance. The patient is nervous/anxious.   ? ?PMFS History:  ?Patient Active Problem List  ? Diagnosis Date Noted  ? Poor appetite 09/28/2013  ? Nausea   ?  ?Past Medical History:  ?Diagnosis Date  ? Kidney disease   ? Lupus ([place])   ? Nausea   ? Reduced Appetite  ?  ?Family History  ?Problem Relation Age of Onset  ? Ulcers Father   ? Asthma Sister   ? Lupus Sister   ? Cholelithiasis Maternal Aunt   ? ?Past Surgical History:  ?Procedure Laterality Date  ? RENAL BIOPSY  Left   ? ?Social History  ? ?Social History Narrative  ? 7th grade 2014-2015  ? ? ?There is no immunization history on file for this patient.  ? ?Objective: ?Vital Signs: BP 119/86 (BP Location: Right Arm, Patient Position: Sitting, Cuff Size: Normal)   Pulse 87   Resp 15   Ht 5\' 5"  (1.651 m)   Wt 142 lb (64.4 kg)   BMI 23.63 kg/m?   ? ?Physical Exam ?Vitals and nursing note reviewed.  ?Constitutional:   ?   Appearance: She is well-developed.  ?HENT:  ?   Head:  Normocephalic and atraumatic.  ?Eyes:  ?   Conjunctiva/sclera: Conjunctivae normal.  ?Cardiovascular:  ?   Rate and Rhythm: Normal rate and regular rhythm.  ?   Heart sounds: Normal heart sounds.  ?Pulmonary:  ?   Effort: Pulmonary effort is normal.  ?   Breath sounds: Normal breath sounds.  ?Abdominal:  ?   General: Bowel sounds are normal.  ?   Palpations: Abdomen is soft.  ?Musculoskeletal:  ?   Cervical back: Normal range of motion.  ?   Right lower leg: Edema present.  ?   Left lower leg: Edema present.  ?   Comments: Generalized edema was noted on face, upper and lower extremities.  ?Lymphadenopathy:  ?   Cervical: No cervical adenopathy.  ?Skin: ?   General: Skin is warm and dry.  ?   Capillary Refill: Capillary refill takes less than 2 seconds.  ?Neurological:  ?   Mental Status: She is alert and oriented to person, place, and time.  ?Psychiatric:     ?   Behavior: Behavior normal.  ?  ? ?Musculoskeletal Exam: C-spine was in good range of motion.  Shoulder joints in good range of motion.  She has contracture in her right elbow joint with some tenderness.  Left elbow joint has mild contracture with some tenderness.  Wrist joints MCPs PIPs and DIPs and good range of motion.  She had edema on her upper and lower extremities.  Facial edema was also noted.  Hip joints were in good range of motion.  She had swelling in her bilateral knee joints with effusion in her right knee joint.  She had no tenderness over ankles or MTPs. ? ?CDAI Exam: ?CDAI Score: -- ?Patient Global: --; Provider Global: -- ?Swollen: --; Tender: -- ?Joint Exam 03/21/2022  ? ?No joint exam has been documented for this visit  ? ?There is currently no information documented on the homunculus. Go to the Rheumatology activity and complete the homunculus joint exam. ? ?Investigation: ?No additional findings. ? ?Imaging: ?US BIOPSY (KIDNEY) ? ?Result Date: 02/20/2022 ?INDICATION: Renal failure EXAM: Ultrasound-guided random renal biopsy MEDICATIONS:  None. ANESTHESIA/SEDATION: Moderate (conscious) sedation was employed during this procedure. A total of Versed 1.5 mg and Fentanyl 75 mcg was administered intravenously by the radiology nurse. Total intra-service moderate Sedation Time: 15 minutes. The patient's level of consciousness and vital signs were monitored continuously by radiology nursing throughout the procedure under my direct supervision. COMPLICATIONS: None immediate. PROCEDURE: Informed written consent was obtained from the patient after a thorough discussion of the procedural risks, benefits and alternatives. All questions were addressed. Maximal Sterile Barrier Technique was utilized including caps, mask, sterile gowns, sterile gloves, sterile drape, hand hygiene and skin antiseptic. A timeout was performed prior to the initiation of the procedure. Patient position prone on the ultrasound table. Left flank skin prepped and draped in usual sterile fashion. Following local lidocaine administration,  15 gauge introducer needle was advanced into the lower pole the left kidney, and 2- 16 gauge cores were obtained utilizing continuous ultrasound guidance. Gelfoam slurry was administered through the introducer needle at the biopsy site. Samples were sent to pathology in sterile saline. Needle removed and hemostasis achieved with 5 minutes of manual compression. Post procedure ultrasound images showed no evidence of significant hemorrhage. IMPRESSION: Ultrasound-guided random biopsy of the lower pole of the left kidney. Electronically Signed   By: Miachel Roux M.D.   On: 02/20/2022 10:02   ? ?Recent Labs: ?Lab Results  ?Component Value Date  ? WBC 6.1 02/20/2022  ? HGB 8.6 (L) 02/20/2022  ? PLT 256 02/20/2022  ? NA 139 02/04/2022  ? K 4.1 02/04/2022  ? CL 108 02/04/2022  ? CO2 25 02/04/2022  ? GLUCOSE 93 02/04/2022  ? BUN 23 (H) 02/04/2022  ? CREATININE 1.02 (H) 02/04/2022  ? BILITOT 0.1 (L) 02/03/2022  ? ALKPHOS 45 02/03/2022  ? AST 14 (L) 02/03/2022  ? ALT  <5 02/03/2022  ? PROT 6.5 02/03/2022  ? ALBUMIN 2.5 (L) 02/03/2022  ? CALCIUM 7.9 (L) 02/04/2022  ? ? ? ? ?Speciality Comments: No specialty comments available. ? ?Procedures:  ?No procedures performed ?Allergies: Pati

## 2022-03-21 ENCOUNTER — Encounter: Payer: Self-pay | Admitting: Rheumatology

## 2022-03-21 ENCOUNTER — Ambulatory Visit: Payer: 59 | Admitting: Rheumatology

## 2022-03-21 VITALS — BP 119/86 | HR 87 | Resp 15 | Ht 65.0 in | Wt 142.0 lb

## 2022-03-21 DIAGNOSIS — M25561 Pain in right knee: Secondary | ICD-10-CM

## 2022-03-21 DIAGNOSIS — N033 Chronic nephritic syndrome with diffuse mesangial proliferative glomerulonephritis: Secondary | ICD-10-CM | POA: Diagnosis not present

## 2022-03-21 DIAGNOSIS — R7989 Other specified abnormal findings of blood chemistry: Secondary | ICD-10-CM

## 2022-03-21 DIAGNOSIS — Z840 Family history of diseases of the skin and subcutaneous tissue: Secondary | ICD-10-CM

## 2022-03-21 DIAGNOSIS — I10 Essential (primary) hypertension: Secondary | ICD-10-CM

## 2022-03-21 DIAGNOSIS — M3214 Glomerular disease in systemic lupus erythematosus: Secondary | ICD-10-CM | POA: Diagnosis not present

## 2022-03-21 DIAGNOSIS — M25521 Pain in right elbow: Secondary | ICD-10-CM

## 2022-03-21 DIAGNOSIS — Z8669 Personal history of other diseases of the nervous system and sense organs: Secondary | ICD-10-CM | POA: Insufficient documentation

## 2022-03-21 DIAGNOSIS — G8929 Other chronic pain: Secondary | ICD-10-CM

## 2022-03-21 DIAGNOSIS — M25461 Effusion, right knee: Secondary | ICD-10-CM

## 2022-03-21 DIAGNOSIS — Z79899 Other long term (current) drug therapy: Secondary | ICD-10-CM | POA: Diagnosis not present

## 2022-03-21 DIAGNOSIS — M25522 Pain in left elbow: Secondary | ICD-10-CM

## 2022-03-21 DIAGNOSIS — M25562 Pain in left knee: Secondary | ICD-10-CM

## 2022-03-21 DIAGNOSIS — M329 Systemic lupus erythematosus, unspecified: Secondary | ICD-10-CM | POA: Insufficient documentation

## 2022-03-26 ENCOUNTER — Encounter (HOSPITAL_BASED_OUTPATIENT_CLINIC_OR_DEPARTMENT_OTHER): Payer: Self-pay | Admitting: Emergency Medicine

## 2022-03-26 ENCOUNTER — Observation Stay (HOSPITAL_BASED_OUTPATIENT_CLINIC_OR_DEPARTMENT_OTHER)
Admission: EM | Admit: 2022-03-26 | Discharge: 2022-03-29 | Disposition: A | Discharge status: Home / Self Care | Payer: 59 | Attending: Internal Medicine | Admitting: Internal Medicine

## 2022-03-26 ENCOUNTER — Emergency Department (HOSPITAL_BASED_OUTPATIENT_CLINIC_OR_DEPARTMENT_OTHER): Payer: 59

## 2022-03-26 ENCOUNTER — Other Ambulatory Visit: Payer: Self-pay

## 2022-03-26 DIAGNOSIS — R1031 Right lower quadrant pain: Secondary | ICD-10-CM | POA: Insufficient documentation

## 2022-03-26 DIAGNOSIS — M3214 Glomerular disease in systemic lupus erythematosus: Principal | ICD-10-CM | POA: Insufficient documentation

## 2022-03-26 DIAGNOSIS — Z79899 Other long term (current) drug therapy: Secondary | ICD-10-CM | POA: Diagnosis not present

## 2022-03-26 DIAGNOSIS — R002 Palpitations: Secondary | ICD-10-CM

## 2022-03-26 DIAGNOSIS — R778 Other specified abnormalities of plasma proteins: Secondary | ICD-10-CM

## 2022-03-26 DIAGNOSIS — I10 Essential (primary) hypertension: Secondary | ICD-10-CM | POA: Diagnosis not present

## 2022-03-26 DIAGNOSIS — Z87891 Personal history of nicotine dependence: Secondary | ICD-10-CM | POA: Insufficient documentation

## 2022-03-26 DIAGNOSIS — Z20822 Contact with and (suspected) exposure to covid-19: Secondary | ICD-10-CM | POA: Diagnosis not present

## 2022-03-26 DIAGNOSIS — M3219 Other organ or system involvement in systemic lupus erythematosus: Secondary | ICD-10-CM | POA: Diagnosis present

## 2022-03-26 DIAGNOSIS — J90 Pleural effusion, not elsewhere classified: Secondary | ICD-10-CM

## 2022-03-26 DIAGNOSIS — R197 Diarrhea, unspecified: Secondary | ICD-10-CM | POA: Diagnosis not present

## 2022-03-26 DIAGNOSIS — R601 Generalized edema: Principal | ICD-10-CM

## 2022-03-26 DIAGNOSIS — R7989 Other specified abnormal findings of blood chemistry: Secondary | ICD-10-CM

## 2022-03-26 DIAGNOSIS — F419 Anxiety disorder, unspecified: Secondary | ICD-10-CM

## 2022-03-26 DIAGNOSIS — R079 Chest pain, unspecified: Secondary | ICD-10-CM

## 2022-03-26 DIAGNOSIS — M329 Systemic lupus erythematosus, unspecified: Secondary | ICD-10-CM

## 2022-03-26 DIAGNOSIS — N2889 Other specified disorders of kidney and ureter: Secondary | ICD-10-CM | POA: Diagnosis present

## 2022-03-26 HISTORY — DX: Essential (primary) hypertension: I10

## 2022-03-26 HISTORY — DX: Systemic involvement of connective tissue, unspecified: M35.9

## 2022-03-26 LAB — URINALYSIS, ROUTINE W REFLEX MICROSCOPIC
Bilirubin Urine: NEGATIVE
Glucose, UA: NEGATIVE mg/dL
Ketones, ur: NEGATIVE mg/dL
Nitrite: NEGATIVE
Protein, ur: >300 mg/dL — AB
Specific Gravity, Urine: 1.011 (ref 1.005–1.030)
pH: 6 (ref 5.0–8.0)

## 2022-03-26 LAB — PREGNANCY, URINE: Preg Test, Ur: NEGATIVE

## 2022-03-26 LAB — COMPREHENSIVE METABOLIC PANEL
ALT: 7 U/L (ref 0–44)
AST: 13 U/L — ABNORMAL LOW (ref 15–41)
Albumin: 2.5 g/dL — ABNORMAL LOW (ref 3.5–5.0)
Alkaline Phosphatase: 44 U/L (ref 38–126)
Anion gap: 9 (ref 5–15)
BUN: 36 mg/dL — ABNORMAL HIGH (ref 6–20)
CO2: 22 mmol/L (ref 22–32)
Calcium: 7.7 mg/dL — ABNORMAL LOW (ref 8.9–10.3)
Chloride: 105 mmol/L (ref 98–111)
Creatinine, Ser: 1.18 mg/dL — ABNORMAL HIGH (ref 0.44–1.00)
GFR, Estimated: >60 mL/min (ref >60)
Glucose, Bld: 127 mg/dL — ABNORMAL HIGH (ref 70–99)
Potassium: 3.7 mmol/L (ref 3.5–5.1)
Sodium: 136 mmol/L (ref 135–145)
Total Bilirubin: 0.2 mg/dL — ABNORMAL LOW (ref 0.3–1.2)
Total Protein: 4.9 g/dL — ABNORMAL LOW (ref 6.5–8.1)

## 2022-03-26 LAB — CBC
HCT: 34 % — ABNORMAL LOW (ref 36.0–46.0)
Hemoglobin: 11.3 g/dL — ABNORMAL LOW (ref 12.0–15.0)
MCH: 26.5 pg (ref 26.0–34.0)
MCHC: 33.2 g/dL (ref 30.0–36.0)
MCV: 79.8 fL — ABNORMAL LOW (ref 80.0–100.0)
Platelets: 400 10*3/uL (ref 150–400)
RBC: 4.26 MIL/uL (ref 3.87–5.11)
RDW: 15.6 % — ABNORMAL HIGH (ref 11.5–15.5)
WBC: 14.3 10*3/uL — ABNORMAL HIGH (ref 4.0–10.5)
nRBC: 0 % (ref 0.0–0.2)

## 2022-03-26 LAB — LIPASE, BLOOD: Lipase: 36 U/L (ref 11–51)

## 2022-03-26 MED ORDER — SODIUM CHLORIDE 0.9 % IV BOLUS
500.0000 mL | Freq: Once | INTRAVENOUS | Status: AC
  Administered 2022-03-26: 500 mL via INTRAVENOUS

## 2022-03-26 MED ORDER — IOHEXOL 300 MG/ML  SOLN
100.0000 mL | Freq: Once | INTRAMUSCULAR | Status: AC | PRN
Start: 2022-03-26 — End: 2022-03-26
  Administered 2022-03-26: 80 mL via INTRAVENOUS

## 2022-03-26 NOTE — ED Triage Notes (Signed)
Pt presents from home for diarrhea, upper abd pain (aching, with some radiation into RLQ, intermittent) x 11days. Says that she feels her heart is racing and her chest feels tight since a couple of days ago, states this may be anxiety about abd pain.  ?No blood in stool, fever, new urinary sx (always foamy urine, polyuria) ? ?H/o lupus (on new medication x 3weeks), CKD(stage 3, not on HD) ?

## 2022-03-27 ENCOUNTER — Emergency Department (HOSPITAL_BASED_OUTPATIENT_CLINIC_OR_DEPARTMENT_OTHER): Payer: 59

## 2022-03-27 ENCOUNTER — Encounter (HOSPITAL_BASED_OUTPATIENT_CLINIC_OR_DEPARTMENT_OTHER): Payer: Self-pay | Admitting: Emergency Medicine

## 2022-03-27 DIAGNOSIS — N2889 Other specified disorders of kidney and ureter: Secondary | ICD-10-CM

## 2022-03-27 DIAGNOSIS — R197 Diarrhea, unspecified: Secondary | ICD-10-CM | POA: Diagnosis not present

## 2022-03-27 DIAGNOSIS — J90 Pleural effusion, not elsewhere classified: Secondary | ICD-10-CM

## 2022-03-27 DIAGNOSIS — M3219 Other organ or system involvement in systemic lupus erythematosus: Secondary | ICD-10-CM | POA: Diagnosis not present

## 2022-03-27 DIAGNOSIS — R778 Other specified abnormalities of plasma proteins: Secondary | ICD-10-CM

## 2022-03-27 DIAGNOSIS — R7989 Other specified abnormal findings of blood chemistry: Secondary | ICD-10-CM

## 2022-03-27 DIAGNOSIS — R601 Generalized edema: Secondary | ICD-10-CM

## 2022-03-27 DIAGNOSIS — I10 Essential (primary) hypertension: Secondary | ICD-10-CM

## 2022-03-27 DIAGNOSIS — R002 Palpitations: Secondary | ICD-10-CM

## 2022-03-27 DIAGNOSIS — R079 Chest pain, unspecified: Secondary | ICD-10-CM

## 2022-03-27 DIAGNOSIS — F419 Anxiety disorder, unspecified: Secondary | ICD-10-CM

## 2022-03-27 LAB — RESP PANEL BY RT-PCR (FLU A&B, COVID) ARPGX2
Influenza A by PCR: NEGATIVE
Influenza B by PCR: NEGATIVE
SARS Coronavirus 2 by RT PCR: NEGATIVE

## 2022-03-27 LAB — TROPONIN I (HIGH SENSITIVITY)
Troponin I (High Sensitivity): 22 ng/L — ABNORMAL HIGH (ref <18)
Troponin I (High Sensitivity): 17 ng/L (ref <18)

## 2022-03-27 MED ORDER — ACETAMINOPHEN 650 MG RE SUPP: 650.0000 mg | Freq: Four times a day (QID) | RECTAL | Status: DC | PRN

## 2022-03-27 MED ORDER — ACETAMINOPHEN 325 MG PO TABS: 650.0000 mg | ORAL_TABLET | Freq: Four times a day (QID) | ORAL | Status: DC | PRN

## 2022-03-27 MED ORDER — FAMOTIDINE 20 MG PO TABS
20.0000 mg | ORAL_TABLET | Freq: Every day | ORAL | Status: DC
  Administered 2022-03-27 – 2022-03-28 (×2): 20 mg via ORAL
  Filled 2022-03-27 (×2): qty 1

## 2022-03-27 MED ORDER — HYDROXYCHLOROQUINE SULFATE 200 MG PO TABS
200.0000 mg | ORAL_TABLET | Freq: Every day | ORAL | Status: DC
Start: 2022-03-27 — End: 2022-03-29
  Administered 2022-03-27 – 2022-03-29 (×3): 200 mg via ORAL
  Filled 2022-03-27 (×3): qty 1

## 2022-03-27 MED ORDER — PREDNISONE 20 MG PO TABS
60.0000 mg | ORAL_TABLET | Freq: Every day | ORAL | Status: DC
  Administered 2022-03-27 – 2022-03-29 (×3): 60 mg via ORAL
  Filled 2022-03-27 (×3): qty 3

## 2022-03-27 MED ORDER — FUROSEMIDE 10 MG/ML IJ SOLN
40.0000 mg | Freq: Once | INTRAMUSCULAR | Status: AC
  Administered 2022-03-27: 40 mg via INTRAVENOUS
  Filled 2022-03-27: qty 4

## 2022-03-27 MED ORDER — ALBUMIN HUMAN 5 % IV SOLN
25.0000 g | Freq: Four times a day (QID) | INTRAVENOUS | Status: AC
  Administered 2022-03-27 – 2022-03-28 (×2): 25 g via INTRAVENOUS
  Filled 2022-03-27 (×4): qty 500

## 2022-03-27 MED ORDER — ENOXAPARIN SODIUM 40 MG/0.4ML IJ SOSY
40.0000 mg | PREFILLED_SYRINGE | INTRAMUSCULAR | Status: DC
  Administered 2022-03-27 – 2022-03-28 (×2): 40 mg via SUBCUTANEOUS
  Filled 2022-03-27 (×2): qty 0.4

## 2022-03-27 MED ORDER — TACROLIMUS 1 MG PO CAPS
1.0000 mg | ORAL_CAPSULE | Freq: Two times a day (BID) | ORAL | Status: DC
  Administered 2022-03-27 – 2022-03-29 (×4): 1 mg via ORAL
  Filled 2022-03-27 (×4): qty 1

## 2022-03-27 MED ORDER — ONDANSETRON HCL 4 MG/2ML IJ SOLN
4.0000 mg | Freq: Four times a day (QID) | INTRAMUSCULAR | Status: DC | PRN
Start: 2022-03-27 — End: 2022-03-29

## 2022-03-27 MED ORDER — MYCOPHENOLATE MOFETIL 250 MG PO CAPS
500.0000 mg | ORAL_CAPSULE | Freq: Two times a day (BID) | ORAL | Status: DC
  Administered 2022-03-27 – 2022-03-29 (×4): 500 mg via ORAL
  Filled 2022-03-27 (×5): qty 2

## 2022-03-27 MED ORDER — FOLIC ACID 1 MG PO TABS
1.0000 mg | ORAL_TABLET | Freq: Every day | ORAL | Status: DC
Start: 2022-03-27 — End: 2022-03-29
  Administered 2022-03-27 – 2022-03-29 (×3): 1 mg via ORAL
  Filled 2022-03-27 (×3): qty 1

## 2022-03-27 MED ORDER — ONDANSETRON HCL 4 MG PO TABS: 4.0000 mg | ORAL_TABLET | Freq: Four times a day (QID) | ORAL | Status: DC | PRN

## 2022-03-27 NOTE — Assessment & Plan Note (Signed)
Due to lupus nephritis?  Albumin 2.5.  Hgb 11.3 (higher than baseline likely from dehydration in the setting of diarrhea).  Chest tightness likely related to anxiety versus cardiac disease.  She denies dyspnea or cough.  Saturating at 100% on RA.  Lungs clear.  CT chest with moderate left-sided pleural effusion. ?-Diuretics per nephrology ?-Check BNP ?-Monitor intake and output and daily weight ?

## 2022-03-27 NOTE — H&P (Signed)
?History and Physical  ? ? ?Alexis Rice [id number] DOB: May 28, 2001 DOA: 03/26/2022 ? ?PCP: Bartholome Bill, MD ?Patient coming from: Home. ? ?Chief Complaint: Chest pain and palpitation ? ?HPI: 21 year old female with PMH of SLE and lupus nephritis and HTN presented to [facility] ED with chest tightness and palpitation. ? ?In ED, tachycardic to 120s but resolved.  Other vital stable.  100% on RA. Cr 1.2 (above baseline).  BUN 38.  WBC 14.3 (lower than prior).  Hgb 11.3 (higher than baseline).  UA with large Hgb but no RBC and > 300 protein.  Pregnancy test negative.  CT abdomen and pelvis with anasarca, mild ascites and moderate left-sided pleural effusion and unusual enhancement of kidneys likely from a lupus nephritis.  LE [place] negative for DVT.  Blood cultures obtained.  Received NS bolus 500 cc and IV Lasix 40 mg x 1.  Nephrology consulted.  Admission accepted by overnight admitted. ? ?Patient had an episode of chest tightness and palpitation for 2 days.  She says her heart rate was high when she checked.  Per patient's mother, this made her anxious and that prompted ED visit.  She reports intermittent chest pain with palpitation due to anxiety since a diagnosis of lupus nephritis.  She also reports diarrhea for about 11 days.  She reports watery bowel movements about 3 episodes a day.  She denies blood in stool.  She reports diffuse abdominal pain that she describes as cramping.  Denies radiation to her back or shoulders.  She denies UTI symptoms or changes in urine output.  Denies fever, chills, shortness of breath or URI symptoms.  Denies nausea or vomiting. ? 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 ?  ? ?ROS ?All review of system negative except for pertinent positives and negatives as history of present illness above. ? ? ?PMH ?Past Medical History:  ?Diagnosis Date  ? Collagen vascular disease ([place])   ? Hypertension   ? Kidney disease   ? Lupus ([place])   ?  Nausea   ? Reduced Appetite  ? ?PSH ?Past Surgical History:  ?Procedure Laterality Date  ? RENAL BIOPSY Left   ? ?Fam HX ?Family History  ?Problem Relation Age of Onset  ? Ulcers Father   ? Asthma Sister   ? Lupus Sister   ? Cholelithiasis Maternal Aunt   ? ? ?Social Hx ? reports that she quit smoking about 15 months ago. Her smoking use included cigarettes. She has a 0.04 pack-year smoking history. She does not have any smokeless tobacco history on file. She reports that she does not drink alcohol and does not use drugs. ? ?Allergy ?No Known Allergies ?Home Meds ?Prior to Admission medications   ?Medication Sig Start Date End Date Taking? Authorizing Provider  ?amLODipine (NORVASC) 10 MG tablet Take 1 tablet (10 mg total) by mouth daily. 02/03/22 03/27/22 Yes Curatolo, Adam, DO  ?famotidine (PEPCID) 20 MG tablet Take 20 mg by mouth daily.   Yes [provider]  ?FOLIC ACID PO Take 1 tablet by mouth daily.   Yes [provider]  ?hydroxychloroquine (PLAQUENIL) 200 MG tablet Take 200 mg by mouth daily. 02/26/22  Yes [provider]  ?LOKELMA 10 g PACK packet Take by mouth 2 (two) times a week. 03/13/22  Yes [provider]  ?mycophenolate (CELLCEPT) 250 MG capsule Take 500 mg by mouth 2 (two) times daily. 02/26/22  Yes [provider]  ?predniSONE (DELTASONE) 20 MG tablet  Take 60 mg by mouth daily. 02/26/22  Yes [provider]  ?torsemide (DEMADEX) 20 MG tablet Take 20 mg by mouth daily.   Yes [provider]  ?acetaminophen (TYLENOL) 500 MG tablet Take by mouth.    [provider]  ? ? ?Physical Exam: ?Vitals:  ? 03/27/22 0930 03/27/22 1230 03/27/22 1430 03/27/22 1612  ?BP: 120/85 (!) 128/95 117/88 124/90  ?Pulse: 79 85 83 87  ?Resp: 17 18  11   ?Temp:    98.3 ?F (36.8 ?C)  ?TempSrc:    Oral  ?SpO2: 100% 100% 99% 100%  ?Weight:      ? ? ?GENERAL: No acute distress.  Appears well.  ?HEENT: MMM.  Vision and hearing grossly intact.  ?NECK: Supple.  No  apparent JVD.  ?RESP:  No IWOB. Good air movement bilaterally. ?CVS:  RRR. Heart sounds normal.  ?ABD/GI/GU: Bowel sounds present. Soft. Non tender.  ?MSK/EXT:  Moves extremities.  2+ BLE edema. ?SKIN: no apparent skin lesion or wound ?NEURO: Awake, alert and oriented appropriately.  No gross deficit.  ?PSYCH: Calm. Normal affect.  ? ?Personally Reviewed Radiological Exams ?See HPI ? ? ?Personally Reviewed Labs: ?CBC: ?Recent Labs  ?Lab 03/21/22 ?1035 03/26/22 ?2204  ?WBC 19.1* 14.3*  ?NEUTROABS 13,408*  --   ?HGB 9.6* 11.3*  ?HCT 29.6* 34.0*  ?MCV 84.6 79.8*  ?PLT 267 400  ? ?Basic Metabolic Panel: ?Recent Labs  ?Lab 03/21/22 ?1035 03/26/22 ?2204  ?NA 141 136  ?K 3.9 3.7  ?CL 108 105  ?CO2 23 22  ?GLUCOSE 70 127*  ?BUN 48* 36*  ?CREATININE 1.23* 1.18*  ?CALCIUM 7.8* 7.7*  ? ?GFR: ?Estimated Creatinine Clearance: 68.4 mL/min (A) (by C-G formula based on SCr of 1.18 mg/dL (H)). ?Liver Function Tests: ?Recent Labs  ?Lab 03/21/22 ?1035 03/26/22 ?2204  ?AST 15 13*  ?ALT 6 7  ?ALKPHOS  --  44  ?BILITOT 0.2 0.2*  ?PROT 4.4* 4.9*  ?ALBUMIN  --  2.5*  ? ?Recent Labs  ?Lab 03/26/22 ?2204  ?LIPASE 36  ? ?No results for input(s): AMMONIA in the last 168 hours. ?Coagulation Profile: ?No results for input(s): INR, PROTIME in the last 168 hours. ?Cardiac Enzymes: ?No results for input(s): CKTOTAL, CKMB, CKMBINDEX, TROPONINI in the last 168 hours. ?BNP (last 3 results) ?No results for input(s): PROBNP in the last 8760 hours. ?HbA1C: ?No results for input(s): HGBA1C in the last 72 hours. ?CBG: ?No results for input(s): GLUCAP in the last 168 hours. ?Lipid Profile: ?No results for input(s): CHOL, HDL, LDLCALC, TRIG, CHOLHDL, LDLDIRECT in the last 72 hours. ?Thyroid Function Tests: ?No results for input(s): TSH, T4TOTAL, FREET4, T3FREE, THYROIDAB in the last 72 hours. ?Anemia Panel: ?No results for input(s): VITAMINB12, FOLATE, FERRITIN, TIBC, IRON, RETICCTPCT in the last 72 hours. ?Urine analysis: ?   ?Component Value Date/Time  ?  [place] YELLOW 03/26/2022 2204  ? APPEARANCEUR CLEAR 03/26/2022 2204  ? LABSPEC 1.011 03/26/2022 2204  ? PHURINE 6.0 03/26/2022 2204  ? GLUCOSEU NEGATIVE 03/26/2022 2204  ? HGBUR LARGE (A) 03/26/2022 2204  ? [place] NEGATIVE 03/26/2022 2204  ? [place] NEGATIVE 03/26/2022 2204  ? PROTEINUR >300 (A) 03/26/2022 2204  ? UROBILINOGEN 0.2 08/18/2013 0137  ? NITRITE NEGATIVE 03/26/2022 2204  ? LEUKOCYTESUR SMALL (A) 03/26/2022 2204  ? ? ?Sepsis Labs:  ?None ? ?Personally Reviewed EKG:  ?Features sinus tachycardia without acute ischemic finding or significant abnormal intervals. ? 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  Still in initial treatment phase.  Renal function is stable.  Denies change in urine output.  She is followed by Dr. Candiss Norse, with [facility]. ?-On prednisone, CellCept and Prograf per nephrology. ?-Monitor urine output and renal function ? ?Anasarca ?Due to lupus nephritis?  Albumin 2.5.  Hgb 11.3 (higher than baseline likely from dehydration in the setting of diarrhea).  Chest tightness likely related to anxiety versus cardiac disease.  She denies dyspnea or cough.  Saturating at 100% on RA.  Lungs clear.  CT chest with moderate left-sided pleural effusion. ?-Diuretics per nephrology ?-Check BNP ?-Monitor intake and output and daily weight ? ?Diarrhea ?Iatrogenic from CellCept?  She has no fever.  Abdominal exam benign.  Leukocytosis likely demargination from steroid versus infection.   ?-Follow C. difficile and GIP ? ?Pleural effusion on left ?May be driven by lupus and hypoalbuminemia.  No respiratory symptoms.  100% on RA. ?-Diuretics per nephrology ?-Consider chest x-ray after diuretics. ? ?Chest pain/palpitation/anxiety ?Understandably anxious with recent diagnosis of lupus nephritis.  Currently chest pain-free with normal heart rate.  EKG features sinus tachycardia. ?-Discussed about bag  breathing ? ?Essential hypertension ?Normotensive off home antihypertensive meds. ?-I suggest a different antihypertensive meds as amlodipine could worsen her edema. ? ?Elevated troponin ?Likely demand ischemia from sin

## 2022-03-27 NOTE — Progress Notes (Signed)
Plan of Care Note for accepted transfer ? ? ?Patient: Alexis Rice MRN: [id number]   DOA: 03/26/2022 ? ?Facility requesting transfer: [facility] ?Requesting Provider: Dr. Nicanor Alcon ?Reason for transfer: Severe Anasarcha,  Chest pain, Abdominal Pain ?Facility course:  ? 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  Patient's disease over the past 2 months has also been complicated by nephrotic syndrome.  Patient follows with Dr. Corliss Skains with rheumatology as well as Dr. Thedore Mins with [facility].  Patient now presents to [facility] with complaints of chest pain, abdominal pain and progressively worsening diffuse edema. ? ?Patient has recently been placed on a regimen of mycophenolate and prednisone with torsemide by her outpatient providers. ? ?Upon evaluation in the emergency department patient was found to have evidence of severe anasarca.  Furthermore, patient was found to have a slightly elevated troponin of 22.  EKG revealing no evidence of dynamic ST segment change. ? ?ER provider concerned about progressive lupus nephritis and anasarca with ongoing chest discomfort.  ER provider did discuss case with Dr. Glenna Fellows agrees with hospitalization for further work-up and management with nephrology to evaluate patient in the morning for consultation. ? ?Plan of care: ?The patient is accepted for admission to Telemetry unit, at [facility]..  ? ? ?Author: ?Marinda Elk, MD ?03/27/2022 ? ?Check [email or web address] for on-call coverage. ? ?Nursing staff, Please call [facility] Admits & Consults System-Wide number on Amion as soon as patient's arrival, so appropriate admitting provider can evaluate the pt. ?

## 2022-03-27 NOTE — Assessment & Plan Note (Addendum)
Understandably anxious with recent diagnosis of lupus nephritis.  Currently chest pain-free with normal heart rate.  EKG features sinus tachycardia. ?-Discussed about bag breathing ?

## 2022-03-27 NOTE — Assessment & Plan Note (Signed)
Likely demand ischemia from sinus tachycardia.  EKG reassuring. ?

## 2022-03-27 NOTE — Assessment & Plan Note (Signed)
Iatrogenic from CellCept?  She has no fever.  Abdominal exam benign.  Leukocytosis likely demargination from steroid versus infection.   ?-Follow C. difficile and GIP ?

## 2022-03-27 NOTE — Hospital Course (Addendum)
21 year old female with PMH of SLE and lupus nephritis and HTN presented to [facility] ED with chest tightness and palpitation. ? ?In ED, tachycardic to 120s but resolved.  Other vital stable.  100% on RA. Cr 1.2 (above baseline).  BUN 38.  WBC 14.3 (lower than prior).  Hgb 11.3 (higher than baseline).  UA with large Hgb but no RBC and > 300 protein.  Pregnancy test negative.  CT abdomen and pelvis with anasarca, mild ascites and moderate left-sided pleural effusion and unusual enhancement of kidneys likely from a lupus nephritis.  LE [place] negative for DVT.  Blood cultures obtained.  Received NS bolus 500 cc and IV Lasix 40 mg x 1.  Nephrology consulted.  Admission accepted by overnight admitted. ? ?Patient had an episode of chest tightness and palpitation for 2 days.  She says her heart rate was high when she checked.  Per patient's mother, this made her anxious and that prompted ED visit.  She reports intermittent chest pain with palpitation due to anxiety since a diagnosis of lupus nephritis.  She also reports diarrhea for about 11 days.  She reports watery bowel movements about 3 episodes a day.  She denies blood in stool.  She reports diffuse abdominal pain that she describes as cramping.  Denies radiation to her back or shoulders.  She denies UTI symptoms or changes in urine output.  Denies fever, chills, shortness of breath or URI symptoms.  Denies nausea or vomiting.  Family history significant for older sister with lupus.  ? 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 ? ?

## 2022-03-27 NOTE — Assessment & Plan Note (Signed)
Patient with recent diagnosis of lupus nephritis class IV/V.  Still in initial treatment phase.  Renal function is stable.  Denies change in urine output.  She is followed by Dr. Thedore Mins, with [place] [facility]. ?-On prednisone, CellCept and Prograf per nephrology. ?-Monitor urine output and renal function ?

## 2022-03-27 NOTE — Assessment & Plan Note (Signed)
May be driven by lupus and hypoalbuminemia.  No respiratory symptoms.  100% on RA. ?-Diuretics per nephrology ?-Consider chest x-ray after diuretics. ?

## 2022-03-27 NOTE — Consult Note (Signed)
Nephrology Consult  ? ?Requesting provider: Inda Merlin ?Service requesting consult: Hospitalist ?Reason for consult: AKI ? ? ?Assessment/Recommendations: Alexis Rice is a/an 21 y.o. female with a past medical history SLE c/b LN who present w/ abdominal pain, diarrhea, and anasarca ? ?Anasarca: 2/2 LN w/ class V predominating. Serum albumin not terrible at 2.5.  Largely driven by underlying lupus.  She does have total body volume overload but has had poor p.o. intake and I worry may be some component of intravascular dehydration.  I will give her IV albumin 5% 25 g x 2 doses. She may need further diuretics but I am holding for now. ? ?LN Class IV/V: still in initial treatment phase so not surprised she has not had significant improvement at this time. It has been difficult to uptitrate MMF due to GI symptoms ?-Crt is at baseline currently at 1.2 ?-Nausea/abd pain as below ?-Pred 60mg  daily ?-Cont MMF 500mg  BID and see how she tolerates it. If diarrhea starts back as soon as we restart this med we may have our answer ?-I will start some tacrolimus low dose at 1mg  BID given nephrotic syndrome and difficulties with MMF; could consider switching to Voc[facility] outpatient (defer to Dr. Candiss Norse). Would be hesitant to do higher dose at this time given diarrhea ?-cont home famotidine and plaquenil  ? ?Nausea/Abd pain/Diarrhea: could be from MMF but differential open. Gut edema and or dehydration may play a role in the nausea. ?-send GI pathogen panel and c dif ?-Cont mmf at 500mg  BID for now ? ?Leukocytosis: most likely 2/2 steroids. CTM for further signs of infection ? ?Troponin elevation: fairly low now normal. Defer any work up to primary ? ?Anemia: hgb 11.3. Overall not concerning. CTM ? ?HTN: bp acceptable. Hold norvasc for now ? ? ?Recommendations conveyed to primary service.  ? ? ?Reesa Chew ?[facility] ?03/27/2022 ?2:00  PM ? ? ?_____________________________________________________________________________________ ?CC: Abdominal Pain ? ?History of Present Illness: [name] is a/an 21 y.o. female with a past medical history of SLE w/ Class IV/V LN who presents with abdominal pain. ? ?Patient's LN was recently diagnosed followed by Dr. Candiss Norse at [facility]. Initially with symptoms in February of this year and found to have proteinuria. She had a kidney biopsy in march that demonstrated: ? ?Native kidney biopsy 02/20/2022 ([place] pathologist Dr. Vonda [facility]): Diffuse proliferative glomerular nephritis, immune complex type, consistent with diffuse lupus nephritis class IV.  Membranous glomerulopathy, consistent with membranous lupus nephritis class V.  Active disease with no underlying interstitial fibrosis/tubular atrophy/arterial intimal fibrosis/arteriolar hyalinosis.  Modified NIH lupus nephritis activity score of 12/24 and chronicity score of 0/12. ? ?Patient was started on pednisone 60mg  daily in early march as well as plaquenil and mycophenolate on 02/27/22. She has had issues with hyperkalemia requiring lokelma every other day. She also has had some diarrhea and abdominal cramping. Initially thought to be a possible GI bug and then possibly related to MMF. Dose was recently decreased to 500mg  BID.  ? ?Patient states she did not have diarrhea until about 10 days ago.  This was several days after she increased her MMF dose to 1000 mg a day.  A couple days ago she decreased to 500 mg a day at the suggestion of Dr. Candiss Norse.  She has continued to have of watery bowel movements daily.  However, she has not had any watery bowel movements today.  She also has not eaten anything today.  She denies any fevers or chills.  No  shortness of breath or blood in her stool.  No hematuria.  She has had cramping abdominal pain and nausea with poor p.o. intake for several days.  Maybe 1 episode of emesis.  She has generally been able to take her  medications and has not missed any doses of prednisone.  Has missed a couple doses of MMF because of the symptoms she is experiencing.  Yesterday she started to have chest tightness which led to her coming to the emergency department. ? ?In the emergency department she did receive IV Lasix 40 mg.  Labs were overall stable and reassuring.  A CT scan of the abdomen demonstrated anasarca and unusual enhancement of the kidneys. ? ? ?Medications:  ?No current facility-administered medications for this encounter.  ? ?Current Outpatient Medications  ?Medication Sig Dispense Refill  ? amLODipine (NORVASC) 10 MG tablet Take 1 tablet (10 mg total) by mouth daily. 30 tablet 0  ? famotidine (PEPCID) 20 MG tablet Take 20 mg by mouth daily.    ? FOLIC ACID PO Take 1 tablet by mouth daily.    ? hydroxychloroquine (PLAQUENIL) 200 MG tablet Take 200 mg by mouth daily.    ? LOKELMA 10 g PACK packet Take by mouth 2 (two) times a week.    ? mycophenolate (CELLCEPT) 250 MG capsule Take 500 mg by mouth 2 (two) times daily.    ? predniSONE (DELTASONE) 20 MG tablet Take 60 mg by mouth daily.    ? torsemide (DEMADEX) 20 MG tablet Take 20 mg by mouth daily.    ? acetaminophen (TYLENOL) 500 MG tablet Take by mouth.    ?  ? ?ALLERGIES ?Patient has no known allergies. ? ?MEDICAL HISTORY ?Past Medical History:  ?Diagnosis Date  ? Collagen vascular disease ([place])   ? Hypertension   ? Kidney disease   ? Lupus ([place])   ? Nausea   ? Reduced Appetite  ?  ? ?SOCIAL HISTORY ?Social History  ? ?Socioeconomic History  ? Marital status: Single  ?  Spouse name: Not on file  ? Number of children: Not on file  ? Years of education: Not on file  ? Highest education level: Not on file  ?Occupational History  ? Not on file  ?Tobacco Use  ? Smoking status: Former  ?  Packs/day: 0.10  ?  Years: 0.40  ?  Pack years: 0.04  ?  Types: Cigarettes  ?  Quit date: 2022  ?  Years since quitting: 1.2  ? Smokeless tobacco: Not on file  ? Tobacco comments:  ?  Smoked for 4  months  ?Vaping Use  ? Vaping Use: Never used  ?Substance and Sexual Activity  ? Alcohol use: No  ? Drug use: Never  ? Sexual activity: Never  ?Other Topics Concern  ? Not on file  ?Social History Narrative  ? 7th grade 2014-2015  ? ?Social Determinants of Health  ? ?Financial Resource Strain: Not on file  ?Food Insecurity: Not on file  ?Transportation Needs: Not on file  ?Physical Activity: Not on file  ?Stress: Not on file  ?Social Connections: Not on file  ?Intimate Partner Violence: Not on file  ?  ? ?FAMILY HISTORY ?Family History  ?Problem Relation Age of Onset  ? Ulcers Father   ? Asthma Sister   ? Lupus Sister   ? Cholelithiasis Maternal Aunt   ?  ? ? ?Review of Systems: ?12 systems reviewed ?Otherwise as per HPI, all other systems reviewed and negative ? ?Physical Exam: ?  Vitals:  ? 03/27/22 0930 03/27/22 1230  ?BP: 120/85 (!) 128/95  ?Pulse: 79 85  ?Resp: 17 18  ?Temp:    ?SpO2: 100% 100%  ? ?Total I/O ?In: -  ?Out: 300 [Urine:300] ? ?Intake/Output Summary (Last 24 hours) at 03/27/2022 1400 ?Last data filed at 03/27/2022 1300 ?Gross per 24 hour  ?Intake 426.69 ml  ?Output 900 ml  ?Net -473.31 ml  ? ?General: Tired appearing, lying in bed, tearful but no significant distress ?HEENT: anicteric sclera, oropharynx clear without lesions ?CV: Tachycardia present on my evaluation, no murmurs, no crackles in the lungs, 2+ pitting edema in the bilateral lower extremities, facial edema present ?Lungs: clear to auscultation bilaterally, normal work of breathing ?Abd: soft, non-tender, non-distended ?Skin: no visible lesions or rashes ?Psych: alert, engaged, appropriate mood and affect ?Musculoskeletal: no obvious deformities ?Neuro: normal speech, no gross focal deficits  ? ?Test Results ?Reviewed ?Lab Results  ?Component Value Date  ? NA 136 03/26/2022  ? K 3.7 03/26/2022  ? CL 105 03/26/2022  ? CO2 22 03/26/2022  ? BUN 36 (H) 03/26/2022  ? CREATININE 1.18 (H) 03/26/2022  ? CALCIUM 7.7 (L) 03/26/2022  ? ALBUMIN 2.5 (L)  03/26/2022  ? ? ?CBC ?Recent Labs  ?Lab 03/21/22 ?1035 03/26/22 ?2204  ?WBC 19.1* 14.3*  ?NEUTROABS 13,408*  --   ?HGB 9.6* 11.3*  ?HCT 29.6* 34.0*  ?MCV 84.6 79.8*  ?PLT 267 400  ? ? ?I have reviewed all relevant outside healthcare records related to the patient's current hospitalization ? ?

## 2022-03-27 NOTE — Progress Notes (Signed)
Nephrology contacted by ED overnight at request of hospitalist re: patient.  Acute management with concern for PE by ED.  We are happy to consult when patient arrives from [facility] to [facility].  Please notify the on call nephrologist when she does arrive.   If any issues arise in the interim that we can help with please reach out as well.   ? ?Estill Bakes MD ?[facility] ?Pager [phone] ? ? ?

## 2022-03-27 NOTE — Assessment & Plan Note (Signed)
Normotensive off home antihypertensive meds. ?-I suggest a different antihypertensive meds as amlodipine could worsen her edema. ?

## 2022-03-27 NOTE — ED Notes (Signed)
Report given to [facility] 

## 2022-03-27 NOTE — ED Provider Notes (Signed)
?[facility] EMERGENCY DEPT ?Provider Note ? ? ?CSN: [id number] ?Arrival date & time: 03/26/22  2118 ? ?  ? ?History ? ?Chief Complaint  ?Patient presents with  ? Abdominal Pain  ? ? ?Alexis Rice is a 21 y.o. female. ? ?The history is provided by the patient and a parent.  ?Abdominal Pain ?Pain location:  Generalized ?Pain radiates to:  Does not radiate ?Pain severity:  Moderate ?Onset quality:  Gradual ?Duration:  11 days ?Timing:  Constant ?Progression:  Unchanged ?Chronicity:  New ?Context: not trauma   ?Relieved by:  Nothing ?Worsened by:  Nothing ?Ineffective treatments:  None tried ?Associated symptoms: chest pain, diarrhea, nausea and vomiting   ?Associated symptoms: no dysuria and no fever   ?Associated symptoms comment:  Only one episode of emesis 11 days ago, but 3 daily episodes of diarrhea x 11 days  ?Risk factors: no NSAID use   ?Patient with lupus with 3 daily episodes of diarrhea and abdominal and chest pain.  No f/c/r.  Urine has been foamy but no additional urinary complaints.   ?  ? ?Home Medications ?Prior to Admission medications   ?Medication Sig Start Date End Date Taking? Authorizing Provider  ?acetaminophen (TYLENOL) 500 MG tablet Take by mouth.    [provider]  ?amLODipine (NORVASC) 10 MG tablet Take 1 tablet (10 mg total) by mouth daily. 02/03/22 03/21/22  Curatolo, Adam, DO  ?ferrous sulfate 325 (65 FE) MG tablet Take 325 mg by mouth daily with breakfast. ?Patient not taking: Reported on 03/21/2022    [provider]  ?FOLIC ACID PO Take 1 tablet by mouth daily.    [provider]  ?hydroxychloroquine (PLAQUENIL) 200 MG tablet daily. 02/26/22   [provider]  ?LOKELMA 10 g PACK packet Take by mouth 2 (two) times a week. 03/13/22   [provider]  ?mycophenolate (CELLCEPT) 250 MG capsule Take 500 mg by mouth 2 (two) times daily. 02/26/22   [provider]  ?predniSONE (DELTASONE) 20 MG tablet Take 60 mg by mouth daily. 02/26/22    [provider]  ?torsemide (DEMADEX) 20 MG tablet Take 20 mg by mouth daily.    [provider]  ?   ? ?Allergies    ?Patient has no known allergies.   ? ?Review of Systems   ?Review of Systems  ?Constitutional:  Negative for fever.  ?HENT:  Negative for drooling.   ?     Periorbital edema   ?Eyes:  Negative for redness.  ?Respiratory:  Negative for wheezing and stridor.   ?Cardiovascular:  Positive for chest pain.  ?Gastrointestinal:  Positive for abdominal pain, diarrhea, nausea and vomiting.  ?Genitourinary:  Negative for dysuria.  ?Neurological:  Negative for facial asymmetry.  ?Psychiatric/Behavioral:  Negative for agitation.   ?All other systems reviewed and are negative. ? ?Physical Exam ?Updated Vital Signs ?BP 125/89   Pulse 100   Temp 98.3 ?F (36.8 ?C) (Oral)   Resp 13   Wt 64.4 kg   LMP 03/16/2022 (Approximate)   SpO2 98%   BMI 23.63 kg/m?  ?Physical Exam ?Vitals and nursing note reviewed.  ?Constitutional:   ?   Appearance: Normal appearance. She is not diaphoretic.  ?HENT:  ?   Head: Normocephalic and atraumatic.  ?   Nose: Nose normal.  ?   Mouth/Throat:  ?   Mouth: Mucous membranes are moist.  ?Eyes:  ?   Conjunctiva/sclera: Conjunctivae normal.  ?   Pupils: Pupils are equal, round, and reactive to  light.  ?   Comments: Periorbital edema   ?Cardiovascular:  ?   Rate and Rhythm: Normal rate and regular rhythm.  ?   Pulses: Normal pulses.  ?   Heart sounds: Normal heart sounds.  ?Pulmonary:  ?   Effort: Pulmonary effort is normal.  ?   Breath sounds: Normal breath sounds.  ?Abdominal:  ?   General: Bowel sounds are normal.  ?   Palpations: Abdomen is soft. There is fluid wave.  ?   Tenderness: There is no abdominal tenderness. There is no guarding.  ?Musculoskeletal:  ?   Cervical back: Normal range of motion and neck supple.  ?   Right lower leg: Edema present.  ?   Left lower leg: Edema present.  ?Skin: ?   General: Skin is warm and dry.  ?   Capillary Refill: Capillary  refill takes less than 2 seconds.  ?Neurological:  ?   General: No focal deficit present.  ?   Mental Status: She is alert and oriented to person, place, and time.  ?   Deep Tendon Reflexes: Reflexes normal.  ?Psychiatric:     ?   Mood and Affect: Mood normal.     ?   Behavior: Behavior normal.  ? ? ?ED Results / Procedures / Treatments   ?Labs ?(all labs ordered are listed, but only abnormal results are displayed) ?Results for orders placed or performed during the hospital encounter of 03/26/22  ?Resp Panel by RT-PCR (Flu A&B, Covid) Nasopharyngeal Swab  ? Specimen: Nasopharyngeal Swab; Nasopharyngeal(NP) swabs in vial transport medium  ?Result Value Ref Range  ? SARS Coronavirus 2 by RT PCR NEGATIVE NEGATIVE  ? Influenza A by PCR NEGATIVE NEGATIVE  ? Influenza B by PCR NEGATIVE NEGATIVE  ?Lipase, blood  ?Result Value Ref Range  ? Lipase 36 11 - 51 U/L  ?Comprehensive metabolic panel  ?Result Value Ref Range  ? Sodium 136 135 - 145 mmol/L  ? Potassium 3.7 3.5 - 5.1 mmol/L  ? Chloride 105 98 - 111 mmol/L  ? CO2 22 22 - 32 mmol/L  ? Glucose, Bld 127 (H) 70 - 99 mg/dL  ? BUN 36 (H) 6 - 20 mg/dL  ? Creatinine, Ser 1.18 (H) 0.44 - 1.00 mg/dL  ? Calcium 7.7 (L) 8.9 - 10.3 mg/dL  ? Total Protein 4.9 (L) 6.5 - 8.1 g/dL  ? Albumin 2.5 (L) 3.5 - 5.0 g/dL  ? AST 13 (L) 15 - 41 U/L  ? ALT 7 0 - 44 U/L  ? Alkaline Phosphatase 44 38 - 126 U/L  ? Total Bilirubin 0.2 (L) 0.3 - 1.2 mg/dL  ? GFR, Estimated >60 >60 mL/min  ? Anion gap 9 5 - 15  ?CBC  ?Result Value Ref Range  ? WBC 14.3 (H) 4.0 - 10.5 K/uL  ? RBC 4.26 3.87 - 5.11 MIL/uL  ? Hemoglobin 11.3 (L) 12.0 - 15.0 g/dL  ? HCT 34.0 (L) 36.0 - 46.0 %  ? MCV 79.8 (L) 80.0 - 100.0 fL  ? MCH 26.5 26.0 - 34.0 pg  ? MCHC 33.2 30.0 - 36.0 g/dL  ? RDW 15.6 (H) 11.5 - 15.5 %  ? Platelets 400 150 - 400 K/uL  ? nRBC 0.0 0.0 - 0.2 %  ?Urinalysis, Routine w reflex microscopic  ?Result Value Ref Range  ? Color, Urine YELLOW YELLOW  ? APPearance CLEAR CLEAR  ? Specific Gravity, Urine 1.011  1.005 - 1.030  ? pH 6.0 5.0 - 8.0  ? Glucose,  UA NEGATIVE NEGATIVE mg/dL  ? Hgb urine dipstick LARGE (A) NEGATIVE  ? Bilirubin Urine NEGATIVE NEGATIVE  ? Ketones, ur NEGATIVE NEGATIVE mg/dL  ? Protein, ur >300 (A) NEGATIVE mg/dL  ? Nitrite NEGATIVE NEGATIVE  ? Leukocytes,Ua SMALL (A) NEGATIVE  ? RBC / HPF 11-20 0 - 5 RBC/hpf  ? WBC, UA 11-20 0 - 5 WBC/hpf  ? Bacteria, UA RARE (A) NONE SEEN  ? Squamous Epithelial / LPF 0-5 0 - 5  ? Mucus PRESENT   ? Hyaline Casts, UA PRESENT   ? Crystals PRESENT (A) NEGATIVE  ?Pregnancy, urine  ?Result Value Ref Range  ? Preg Test, Ur NEGATIVE NEGATIVE  ?Troponin I (High Sensitivity)  ?Result Value Ref Range  ? Troponin I (High Sensitivity) 22 (H) <18 ng/L  ? ?CT ABDOMEN PELVIS W CONTRAST ? ?Result Date: 03/27/2022 ?CLINICAL DATA:  Abdominal pain, acute, nonlocalized. Right lower quadrant abdominal pain, diarrhea. Lupus. EXAM: CT ABDOMEN AND PELVIS WITH CONTRAST TECHNIQUE: Multidetector CT imaging of the abdomen and pelvis was performed using the standard protocol following bolus administration of intravenous contrast. RADIATION DOSE REDUCTION: This exam was performed according to the departmental dose-optimization program which includes automated exposure control, adjustment of the mA and/or kV according to patient size and/or use of iterative reconstruction technique. CONTRAST:  78mL OMNIPAQUE IOHEXOL 300 MG/ML  SOLN COMPARISON:  None. FINDINGS: Lower chest: At least moderate left pleural effusion. Cardiac size within normal limits. Hepatobiliary: No focal liver abnormality is seen. No gallstones, gallbladder wall thickening, or biliary dilatation. Pancreas: Unremarkable Spleen: Unremarkable Adrenals/Urinary Tract: The adrenal glands are unremarkable. The kidneys are normal in size and position. There is an unusual enhancement pattern of the kidneys bilaterally with relatively low attenuation of the subcapsular cortices which may relate to parenchymal edema. Preserved cortical  thickness. No hydronephrosis. No enhancing intrarenal masses. No intrarenal or ureteral calculi. The bladder is unremarkable. Stomach/Bowel: Mild ascites. The stomach, small bowel, and large bowel are unremarkable. Appendi

## 2022-03-28 DIAGNOSIS — N2889 Other specified disorders of kidney and ureter: Secondary | ICD-10-CM | POA: Diagnosis not present

## 2022-03-28 DIAGNOSIS — M3219 Other organ or system involvement in systemic lupus erythematosus: Secondary | ICD-10-CM | POA: Diagnosis not present

## 2022-03-28 LAB — CARDIOLIPIN ANTIBODIES, IGG, IGM, IGA
Anticardiolipin IgA: 6.7 APL-U/mL (ref <20.0)
Anticardiolipin IgG: <2 GPL-U/mL (ref <20.0)
Anticardiolipin IgM: 5.2 MPL-U/mL (ref <20.0)

## 2022-03-28 LAB — PHOSPHATIDYLSERINE/PROTHROMBIN (PS/PT) ANTIBODIES (IGG, IGM)
Phosphatidylserine/Prothrombin Ab (IgG): <9 U (ref <=30)
Phosphatidylserine/Prothrombin Ab (IgM): <9 U (ref <=30)

## 2022-03-28 LAB — CBC WITH DIFFERENTIAL/PLATELET
Absolute Monocytes: 1261 cells/uL — ABNORMAL HIGH (ref 200–950)
Basophils Absolute: 19 cells/uL (ref 0–200)
Basophils Relative: 0.1 %
Eosinophils Absolute: 38 cells/uL (ref 15–500)
Eosinophils Relative: 0.2 %
HCT: 29.6 % — ABNORMAL LOW (ref 35.0–45.0)
Hemoglobin: 9.6 g/dL — ABNORMAL LOW (ref 11.7–15.5)
Lymphs Abs: 4374 cells/uL — ABNORMAL HIGH (ref 850–3900)
MCH: 27.4 pg (ref 27.0–33.0)
MCHC: 32.4 g/dL (ref 32.0–36.0)
MCV: 84.6 fL (ref 80.0–100.0)
MPV: 10.1 fL (ref 7.5–12.5)
Monocytes Relative: 6.6 %
Neutro Abs: 13408 cells/uL — ABNORMAL HIGH (ref 1500–7800)
Neutrophils Relative %: 70.2 %
Platelets: 267 10*3/uL (ref 140–400)
RBC: 3.5 10*6/uL — ABNORMAL LOW (ref 3.80–5.10)
RDW: 15.5 % — ABNORMAL HIGH (ref 11.0–15.0)
Total Lymphocyte: 22.9 %
WBC: 19.1 10*3/uL — ABNORMAL HIGH (ref 3.8–10.8)

## 2022-03-28 LAB — ANTI-NUCLEAR AB-TITER (ANA TITER): ANA Titer 1: 1:320 {titer} — ABNORMAL HIGH

## 2022-03-28 LAB — COMPLETE METABOLIC PANEL WITH GFR
AG Ratio: 1.1 (calc) (ref 1.0–2.5)
ALT: 6 U/L (ref 6–29)
AST: 15 U/L (ref 10–30)
Albumin: 2.3 g/dL — ABNORMAL LOW (ref 3.6–5.1)
Alkaline phosphatase (APISO): 39 U/L (ref 31–125)
BUN/Creatinine Ratio: 39 (calc) — ABNORMAL HIGH (ref 6–22)
BUN: 48 mg/dL — ABNORMAL HIGH (ref 7–25)
CO2: 23 mmol/L (ref 20–32)
Calcium: 7.8 mg/dL — ABNORMAL LOW (ref 8.6–10.2)
Chloride: 108 mmol/L (ref 98–110)
Creat: 1.23 mg/dL — ABNORMAL HIGH (ref 0.50–0.96)
Globulin: 2.1 g/dL (calc) (ref 1.9–3.7)
Glucose, Bld: 70 mg/dL (ref 65–99)
Potassium: 3.9 mmol/L (ref 3.5–5.3)
Sodium: 141 mmol/L (ref 135–146)
Total Bilirubin: 0.2 mg/dL (ref 0.2–1.2)
Total Protein: 4.4 g/dL — ABNORMAL LOW (ref 6.1–8.1)
eGFR: 65 mL/min/{1.73_m2} (ref >=60)

## 2022-03-28 LAB — ANTI-SMITH ANTIBODY: ENA SM Ab Ser-aCnc: >8 AI — AB

## 2022-03-28 LAB — MAGNESIUM: Magnesium: 2.1 mg/dL (ref 1.7–2.4)

## 2022-03-28 LAB — SJOGRENS SYNDROME-B EXTRACTABLE NUCLEAR ANTIBODY: SSB (La) (ENA) Antibody, IgG: <1 AI

## 2022-03-28 LAB — ANTI-DNA ANTIBODY, DOUBLE-STRANDED: ds DNA Ab: 17 IU/mL — ABNORMAL HIGH

## 2022-03-28 LAB — LUPUS ANTICOAGULANT EVAL W/ REFLEX
PTT-LA Screen: 26 s (ref <=40)
dRVVT: 37 s (ref <=45)

## 2022-03-28 LAB — BETA-2 GLYCOPROTEIN ANTIBODIES
Beta-2 Glyco 1 IgA: 6.4 U/mL (ref <20.0)
Beta-2 Glyco 1 IgM: 6.3 U/mL (ref <20.0)
Beta-2 Glyco I IgG: <2 U/mL (ref <20.0)

## 2022-03-28 LAB — CBC
HCT: 21.7 % — ABNORMAL LOW (ref 36.0–46.0)
Hemoglobin: 7.5 g/dL — ABNORMAL LOW (ref 12.0–15.0)
MCH: 27.8 pg (ref 26.0–34.0)
MCHC: 34.6 g/dL (ref 30.0–36.0)
MCV: 80.4 fL (ref 80.0–100.0)
Platelets: 243 10*3/uL (ref 150–400)
RBC: 2.7 MIL/uL — ABNORMAL LOW (ref 3.87–5.11)
RDW: 15.7 % — ABNORMAL HIGH (ref 11.5–15.5)
WBC: 8.2 10*3/uL (ref 4.0–10.5)
nRBC: 0 % (ref 0.0–0.2)

## 2022-03-28 LAB — RENAL FUNCTION PANEL
Albumin: 2.1 g/dL — ABNORMAL LOW (ref 3.5–5.0)
Anion gap: 6 (ref 5–15)
BUN: 29 mg/dL — ABNORMAL HIGH (ref 6–20)
CO2: 22 mmol/L (ref 22–32)
Calcium: 7.5 mg/dL — ABNORMAL LOW (ref 8.9–10.3)
Chloride: 109 mmol/L (ref 98–111)
Creatinine, Ser: 1.05 mg/dL — ABNORMAL HIGH (ref 0.44–1.00)
GFR, Estimated: >60 mL/min (ref >60)
Glucose, Bld: 123 mg/dL — ABNORMAL HIGH (ref 70–99)
Phosphorus: 4 mg/dL (ref 2.5–4.6)
Potassium: 4 mmol/L (ref 3.5–5.1)
Sodium: 137 mmol/L (ref 135–145)

## 2022-03-28 LAB — RETICULOCYTES
Immature Retic Fract: 7.1 % (ref 2.3–15.9)
RBC.: 2.67 MIL/uL — ABNORMAL LOW (ref 3.87–5.11)
Retic Count, Absolute: 52.6 10*3/uL (ref 19.0–186.0)
Retic Ct Pct: 2 % (ref 0.4–3.1)

## 2022-03-28 LAB — HIV ANTIBODY (ROUTINE TESTING W REFLEX): HIV Screen 4th Generation wRfx: NONREACTIVE

## 2022-03-28 LAB — IRON AND TIBC
Iron: 23 ug/dL — ABNORMAL LOW (ref 28–170)
Saturation Ratios: 22 % (ref 10.4–31.8)
TIBC: 105 ug/dL — ABNORMAL LOW (ref 250–450)
UIBC: 82 ug/dL

## 2022-03-28 LAB — CK: Total CK: 45 U/L (ref 38–234)

## 2022-03-28 LAB — C3 AND C4
C3 Complement: 67 mg/dL — ABNORMAL LOW (ref 83–193)
C4 Complement: 24 mg/dL (ref 15–57)

## 2022-03-28 LAB — ANA: Anti Nuclear Antibody (ANA): POSITIVE — AB

## 2022-03-28 LAB — RNP ANTIBODY: Ribonucleic Protein(ENA) Antibody, IgG: 3.1 AI — AB

## 2022-03-28 LAB — CYCLIC CITRUL PEPTIDE ANTIBODY, IGG: Cyclic Citrullin Peptide Ab: <16 UNITS

## 2022-03-28 LAB — VITAMIN B12: Vitamin B-12: 223 pg/mL (ref 180–914)

## 2022-03-28 LAB — ANTI-SCLERODERMA ANTIBODY: Scleroderma (Scl-70) (ENA) Antibody, IgG: <1 AI

## 2022-03-28 LAB — BRAIN NATRIURETIC PEPTIDE: B Natriuretic Peptide: 14 pg/mL (ref 0.0–100.0)

## 2022-03-28 LAB — SJOGRENS SYNDROME-A EXTRACTABLE NUCLEAR ANTIBODY: SSA (Ro) (ENA) Antibody, IgG: 3.9 AI — AB

## 2022-03-28 LAB — FOLATE: Folate: 30 ng/mL (ref >5.9)

## 2022-03-28 LAB — RHEUMATOID FACTOR: Rheumatoid fact SerPl-aCnc: <14 IU/mL (ref <14)

## 2022-03-28 LAB — FERRITIN: Ferritin: 96 ng/mL (ref 11–307)

## 2022-03-28 MED ORDER — ADULT MULTIVITAMIN W/MINERALS CH
1.0000 | ORAL_TABLET | Freq: Every day | ORAL | Status: DC
  Administered 2022-03-28 – 2022-03-29 (×2): 1 via ORAL
  Filled 2022-03-28 (×2): qty 1

## 2022-03-28 MED ORDER — ENSURE ENLIVE PO LIQD
237.0000 mL | Freq: Two times a day (BID) | ORAL | Status: DC
  Administered 2022-03-28 – 2022-03-29 (×3): 237 mL via ORAL

## 2022-03-28 MED ORDER — FAMOTIDINE 20 MG PO TABS
20.0000 mg | ORAL_TABLET | Freq: Two times a day (BID) | ORAL | Status: DC
  Administered 2022-03-28 – 2022-03-29 (×2): 20 mg via ORAL
  Filled 2022-03-28 (×2): qty 1

## 2022-03-28 NOTE — Progress Notes (Signed)
?[facility] ?Progress Note  ? ? ?Assessment/ Plan:   ?Alexis Rice is a/an 21 y.o. female with a past medical history SLE c/b LN who present w/ abdominal pain, diarrhea, and anasarca ?  ?Anasarca: 2/2 LN w/ class V predominating. Serum albumin not terrible at 2.5.  Largely driven by underlying lupus.   ?- IV albumin 25 % x 2 doses ?- holding diuretics ?  ?LN Class IV/V: still in initial treatment phase so not surprised she has not had significant improvement at this time. It has been difficult to uptitrate MMF due to GI symptoms ?-Crt is at baseline currently at 1.2 ?-Nausea/abd pain as below ?-Pred 60mg  daily ?-Cont MMF 500mg  BID, seems to be OK at present but hasn't had lunch yet ?- had a formed stool this AM ?- 1 mg BID tac started ?-cont home famotidine and plaquenil  ?  ?Nausea/Abd pain/Diarrhea:  ?- stool studies discontinued ?- CT scan OK 03/26/22 ?  ?Leukocytosis: most likely 2/2 steroids. CTM for further signs of infection ?  ?Troponin elevation: fairly low now normal. Defer any work up to primary ?  ?Anemia: hgb 11.3. Overall not concerning. CTM ?  ?HTN: bp acceptable. Hold norvasc for now ? ?  ? ?Subjective:   ? ?Seen in room- ate breakfast and had 500 mg cellcept with some abd cramping- couldn't tell if it was the food or the meds.  Mom at bedside.    ? ?Objective:   ?BP 127/86 (BP Location: Left Arm)   Pulse 90   Temp 98.2 ?F (36.8 ?C) (Oral)   Resp 14   Wt 62.5 kg   LMP 03/16/2022 (Approximate)   SpO2 100%   BMI 22.93 kg/m?  ? ?Intake/Output Summary (Last 24 hours) at 03/28/2022 1437 ?Last data filed at 03/28/2022 0343 ?Gross per 24 hour  ?Intake 490.39 ml  ?Output 300 ml  ?Net 190.39 ml  ? ?Weight change: -1.911 kg ? ?Physical Exam: ?Gen:NAD, sitting in bed ?HEENT: puffy face ?CVS: RRR ?Resp: clear ?Abd: soft, nontender ?Ext: 1+ anasarca ? ?Imaging: ?CT ABDOMEN PELVIS W CONTRAST ? ?Result Date: 03/27/2022 ?CLINICAL DATA:  Abdominal pain, acute, nonlocalized. Right lower quadrant  abdominal pain, diarrhea. Lupus. EXAM: CT ABDOMEN AND PELVIS WITH CONTRAST TECHNIQUE: Multidetector CT imaging of the abdomen and pelvis was performed using the standard protocol following bolus administration of intravenous contrast. RADIATION DOSE REDUCTION: This exam was performed according to the departmental dose-optimization program which includes automated exposure control, adjustment of the mA and/or kV according to patient size and/or use of iterative reconstruction technique. CONTRAST:  21mL OMNIPAQUE IOHEXOL 300 MG/ML  SOLN COMPARISON:  None. FINDINGS: Lower chest: At least moderate left pleural effusion. Cardiac size within normal limits. Hepatobiliary: No focal liver abnormality is seen. No gallstones, gallbladder wall thickening, or biliary dilatation. Pancreas: Unremarkable Spleen: Unremarkable Adrenals/Urinary Tract: The adrenal glands are unremarkable. The kidneys are normal in size and position. There is an unusual enhancement pattern of the kidneys bilaterally with relatively low attenuation of the subcapsular cortices which may relate to parenchymal edema. Preserved cortical thickness. No hydronephrosis. No enhancing intrarenal masses. No intrarenal or ureteral calculi. The bladder is unremarkable. Stomach/Bowel: Mild ascites. The stomach, small bowel, and large bowel are unremarkable. Appendix normal. No free intraperitoneal gas. Vascular/Lymphatic: No significant vascular findings are present. No enlarged abdominal or pelvic lymph nodes. Reproductive: 23 mm simple appearing cyst within the right ovary. No follow-up imaging is recommended in a patient of this age. The pelvic organs are otherwise unremarkable.  Other: There is moderate diffuse subcutaneous body wall edema. No abdominal wall hernia. Musculoskeletal: No acute bone abnormality. No lytic or blastic bone lesion. IMPRESSION: Anasarca with mild ascites, diffuse body wall subcutaneous edema, and moderate left pleural effusion. Unusual  enhancement pattern of the kidneys most suggestive of an underlying inflammatory process with resultant cortical edema. This may relate to the patient's underlying connective tissue disorder and reflect changes of lupus nephritis. 2.3 cm right ovarian simple-appearing cyst. No follow-up imaging is recommended. Reference: JACR 2020 Feb;17(2):248-254 Electronically Signed   By: Helyn Numbers M.D.   On: 03/27/2022 00:18  ? ?US Venous Img Lower Bilateral (DVT) ? ?Result Date: 03/27/2022 ?CLINICAL DATA:  Edema EXAM: BILATERAL LOWER EXTREMITY VENOUS DOPPLER ULTRASOUND TECHNIQUE: Gray-scale sonography with compression, as well as color and duplex ultrasound, were performed to evaluate the deep venous system(s) from the level of the common femoral vein through the popliteal and proximal calf veins. COMPARISON:  None. FINDINGS: VENOUS Normal compressibility of the common femoral, superficial femoral, and popliteal veins, as well as the visualized calf veins. Visualized portions of profunda femoral vein and great saphenous vein unremarkable. No filling defects to suggest DVT on grayscale or color Doppler imaging. Doppler waveforms show normal direction of venous flow, normal respiratory plasticity and response to augmentation. Limited views of the contralateral common femoral vein are unremarkable. OTHER Diffuse soft tissue edema Limitations: none IMPRESSION: 1. No deep venous thrombosis identified in the bilateral lower extremities. 2. Diffuse edema in the lower extremity soft tissues Electronically Signed   By: Deatra Robinson M.D.   On: 03/27/2022 02:06   ? ?Labs: ?BMET ?Recent Labs  ?Lab 03/26/22 ?2204 03/28/22 ?0243  ?NA 136 137  ?K 3.7 4.0  ?CL 105 109  ?CO2 22 22  ?GLUCOSE 127* 123*  ?BUN 36* 29*  ?CREATININE 1.18* 1.05*  ?CALCIUM 7.7* 7.5*  ?PHOS  --  4.0  ? ?CBC ?Recent Labs  ?Lab 03/26/22 ?2204 03/28/22 ?0243  ?WBC 14.3* 8.2  ?HGB 11.3* 7.5*  ?HCT 34.0* 21.7*  ?MCV 79.8* 80.4  ?PLT 400 243  ? ? ?Medications:   ? ?  enoxaparin (LOVENOX) injection  40 mg Subcutaneous Q24H  ? famotidine  20 mg Oral Daily  ? feeding supplement  237 mL Oral BID BM  ? folic acid  1 mg Oral Daily  ? hydroxychloroquine  200 mg Oral Daily  ? multivitamin with minerals  1 tablet Oral Daily  ? mycophenolate  500 mg Oral BID  ? predniSONE  60 mg Oral Q breakfast  ? tacrolimus  1 mg Oral BID  ? ? ? ? ?Bufford Buttner, MD ?03/28/2022, 2:37 PM   ?

## 2022-03-28 NOTE — Progress Notes (Signed)
?  Transition of Care (TOC) Screening Note ? ? ?Patient Details  ?Name: Alexis Rice ?Date of Birth: 05-23-2001 ? ? ?Transition of Care (TOC) CM/SW Contact:    ?Mearl Latin, LCSW ?Phone Number: ?03/28/2022, 11:07 AM ? ? ? ?Transition of Care Department [facility]) has reviewed patient and no TOC needs have been identified at this time. We will continue to monitor patient advancement through interdisciplinary progression rounds. If new patient transition needs arise, please place a TOC consult. ? ? ?

## 2022-03-28 NOTE — Progress Notes (Signed)
?PROGRESS NOTE ? ? ? ?Alexis Rice  GHW:[id number] DOB: 2001-07-15 DOA: 03/26/2022 ?PCP: Verlon Au, MD  ? ? ?Brief Narrative:  ?21 year old with SLE and recently diagnosed lupus nephritis, hypertension who is currently on mycophenolate and prednisone presented to the ER with chest tightness and palpitations, episodic nausea and abdominal discomfort.  She arrived to ER with tachycardia heart rate 120.  100% room air.  Creatinine 1.2 slightly above the baseline.  WBC 14.3, hemoglobin 11.3 higher than baseline.  CT scan abdomen pelvis with anasarca, mild ascites and moderate left-sided pleural effusion.  Admitted due to symptomatic dehydration, intolerance to therapies. ? ? ?Assessment & Plan: ?  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  Received 2 doses of IV albumin.  Diuretics on hold.  Fairly stable today. ? ?Lupus nephritis: ?Recently started on treatment, currently on prednisone 60 mg daily, mycophenolate 500 mg twice daily. ?Started on tacrolimus 1 mg twice daily. ?She is already on Plaquenil. ?There is some question about intolerance to mycophenolate. ?She has gastrointestinal disturbance, increased Pepcid to 20 mg twice daily.  Take medications with meal. ? ?Nausea, abdominal pain and diarrhea, gastric intolerance, mild dehydration and AKI: ?Treated symptomatically.  Symptoms improved. ?WBC normalized, hemoglobin back to her usual self. ?Recheck tomorrow morning. ?Allow regular diet, spacing medications. ? ? ?DVT prophylaxis: enoxaparin (LOVENOX) injection 40 mg Start: 03/27/22 1815 ? ? ?Code Status: Full code ?Family Communication: Mother at bedside ?Disposition Plan: Status is: Inpatient ?Remains inpatient appropriate because: Monitoring toxic medication use ?  ? ? ?Consultants:  ?Nephrology ? ?Procedures:  ?None ? ?Antimicrobials:  ?None ? ? ?Subjective: ?Patient seen and examined in the morning rounds.  She had epigastric discomfort, some nausea and one  episode of loose stool but it was better than earlier days.  She have not have ongoing diarrhea.  After the episode, she denied any complaints. ?She has no issues tolerating lunch. ?Patient is very nervous about taking another dose of mycophenolate, ? ?Went back to examine patient in the evening for discharge readiness, mother at the bedside.  Patient had uneventful lunch, however he is anxious about taking a low-dose of mycophenolate causing gastric symptoms nausea.  Recommended monitoring tonight in the hospital. ? ?Objective: ?Vitals:  ? 03/27/22 2358 03/28/22 0512 03/28/22 0805 03/28/22 1100  ?BP: 110/87 118/84 132/84 127/86  ?Pulse: 83 (!) 101 85 90  ?Resp: 13 18 11 14   ?Temp: 98.3 ?F (36.8 ?C) 98.4 ?F (36.9 ?C)  98.2 ?F (36.8 ?C)  ?TempSrc: Oral Oral  Oral  ?SpO2: 95% 100% 98% 100%  ?Weight:  62.5 kg    ? ? ?Intake/Output Summary (Last 24 hours) at 03/28/2022 1648 ?Last data filed at 03/28/2022 0343 ?Gross per 24 hour  ?Intake 490.39 ml  ?Output 300 ml  ?Net 190.39 ml  ? ?Filed Weights  ? 03/26/22 2202 03/28/22 0512  ?Weight: 64.4 kg 62.5 kg  ? ? ?Examination: ? ?General exam: Appears calm and comfortable  ?Currently sitting at the edge of the bed.  On room air. ?Respiratory system: Clear to auscultation. Respiratory effort normal.  No added sounds. ?Cardiovascular system: S1 & S2 heard, RRR.  ?Gastrointestinal system: Soft.  Nontender.  Bowel sound present. ?Central nervous system: Alert and oriented. No focal neurological deficits. ?Extremities: Symmetric 5 x 5 power. ?1+ edema bilateral leg, more edema on the dorsum of the feet. ? ? ? ?Data Reviewed: I have personally reviewed following labs and imaging studies ? ?CBC: ?Recent Labs  ?Lab 03/26/22 ?2204 03/28/22 ?  0243  ?WBC 14.3* 8.2  ?HGB 11.3* 7.5*  ?HCT 34.0* 21.7*  ?MCV 79.8* 80.4  ?PLT 400 243  ? ?Basic Metabolic Panel: ?Recent Labs  ?Lab 03/26/22 ?2204 03/28/22 ?0243  ?NA 136 137  ?K 3.7 4.0  ?CL 105 109  ?CO2 22 22  ?GLUCOSE 127* 123*  ?BUN 36* 29*   ?CREATININE 1.18* 1.05*  ?CALCIUM 7.7* 7.5*  ?MG  --  2.1  ?PHOS  --  4.0  ? ?GFR: ?Estimated Creatinine Clearance: 76.9 mL/min (A) (by C-G formula based on SCr of 1.05 mg/dL (H)). ?Liver Function Tests: ?Recent Labs  ?Lab 03/26/22 ?2204 03/28/22 ?0243  ?AST 13*  --   ?ALT 7  --   ?ALKPHOS 44  --   ?BILITOT 0.2*  --   ?PROT 4.9*  --   ?ALBUMIN 2.5* 2.1*  ? ?Recent Labs  ?Lab 03/26/22 ?2204  ?LIPASE 36  ? ?No results for input(s): AMMONIA in the last 168 hours. ?Coagulation Profile: ?No results for input(s): INR, PROTIME in the last 168 hours. ?Cardiac Enzymes: ?Recent Labs  ?Lab 03/28/22 ?0243  ?CKTOTAL 45  ? ?BNP (last 3 results) ?No results for input(s): PROBNP in the last 8760 hours. ?HbA1C: ?No results for input(s): HGBA1C in the last 72 hours. ?CBG: ?No results for input(s): GLUCAP in the last 168 hours. ?Lipid Profile: ?No results for input(s): CHOL, HDL, LDLCALC, TRIG, CHOLHDL, LDLDIRECT in the last 72 hours. ?Thyroid Function Tests: ?No results for input(s): TSH, T4TOTAL, FREET4, T3FREE, THYROIDAB in the last 72 hours. ?Anemia Panel: ?Recent Labs  ?  03/28/22 ?0243  ?VITAMINB12 223  ?FOLATE 30.0  ?FERRITIN 96  ?TIBC 105*  ?IRON 23*  ?RETICCTPCT 2.0  ? ?Sepsis Labs: ?No results for input(s): PROCALCITON, LATICACIDVEN in the last 168 hours. ? ?Recent Results (from the past 240 hour(s))  ?Resp Panel by RT-PCR (Flu A&B, Covid) Nasopharyngeal Swab     Status: None  ? Collection Time: 03/27/22 12:55 AM  ? Specimen: Nasopharyngeal Swab; Nasopharyngeal(NP) swabs in vial transport medium  ?Result Value Ref Range Status  ? SARS Coronavirus 2 by RT PCR NEGATIVE NEGATIVE Final  ?  Comment: (NOTE) ?SARS-CoV-2 target nucleic acids are NOT DETECTED. ? ?The SARS-CoV-2 RNA is generally detectable in upper respiratory ?specimens during the acute phase of infection. The lowest ?concentration of SARS-CoV-2 viral copies this assay can detect is ?138 copies/mL. A negative result does not preclude SARS-Cov-2 ?infection and should  not be used as the sole basis for treatment or ?other patient management decisions. A negative result may occur with  ?improper specimen collection/handling, submission of specimen other ?than nasopharyngeal swab, presence of viral mutation(s) within the ?areas targeted by this assay, and inadequate number of viral ?copies(<138 copies/mL). A negative result must be combined with ?clinical observations, patient history, and epidemiological ?information. The expected result is Negative. ? ?Fact Sheet for Patients:  ?BloggerCourse.comhttps://www.fda.gov/media/152166/download ? ?Fact Sheet for Healthcare Providers:  ?SeriousBroker.ithttps://www.fda.gov/media/152162/download ? ?This test is no t yet approved or cleared by the [place] FDA and  ?has been authorized for detection and/or diagnosis of SARS-CoV-2 by ?FDA under an Emergency Use Authorization (EUA). This EUA will remain  ?in effect (meaning this test can be used) for the duration of the ?COVID-19 declaration under Section 564(b)(1) of the Act, 21 ?U.S.C.section 360bbb-3(b)(1), unless the authorization is terminated  ?or revoked sooner.  ? ? ?  ? Influenza A by PCR NEGATIVE NEGATIVE Final  ? Influenza B by PCR NEGATIVE NEGATIVE Final  ?  Comment: (NOTE) ?The  Xpert Xpress SARS-CoV-2/FLU/RSV plus assay is intended as an aid ?in the diagnosis of influenza from Nasopharyngeal swab specimens and ?should not be used as a sole basis for treatment. Nasal washings and ?aspirates are unacceptable for Xpert Xpress SARS-CoV-2/FLU/RSV ?testing. ? ?Fact Sheet for Patients: ?BloggerCourse.com ? ?Fact Sheet for Healthcare Providers: ?SeriousBroker.it ? ?This test is not yet approved or cleared by the [place] FDA and ?has been authorized for detection and/or diagnosis of SARS-CoV-2 by ?FDA under an Emergency Use Authorization (EUA). This EUA will remain ?in effect (meaning this test can be used) for the duration of the ?COVID-19 declaration under  Section 564(b)(1) of the Act, 21 U.S.C. ?section 360bbb-3(b)(1), unless the authorization is terminated or ?revoked. ? ?Performed at [facility], [address] ?[place], [place] [address]

## 2022-03-28 NOTE — Progress Notes (Signed)
Initial Nutrition Assessment ? ?DOCUMENTATION CODES:  ? ?Not applicable ? ?INTERVENTION:  ? ?-MVI with minerals daily ?-Ensure Enlive po BID, each supplement provides 350 kcal and 20 grams of protein ?-Liberalize diet to 2 gram sodium for wider variety of meal selections in attempt to improve oral intake ? ?NUTRITION DIAGNOSIS:  ? ?Increased nutrient needs related to chronic illness (lupus nephritis) as evidenced by estimated needs. ? ?GOAL:  ? ?Patient will meet greater than or equal to 90% of their needs ? ?MONITOR:  ? ?PO intake, Supplement acceptance, Labs, Weight trends, Skin, I & O's ? ?REASON FOR ASSESSMENT:  ? ?Malnutrition Screening Tool ?  ? ?ASSESSMENT:  ? ?21 year old female with PMH of SLE and lupus nephritis and HTN presented to [facility] ED with chest tightness and palpitation. ? 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 ? ?Reviewed I/O's: -110 ml x 24 hours and -283 ml since admission ? ?UOP: 600 ml x 24 hours ?  ?Pt unavailable at time of visit. Attempted to speak with pt via call to hospital room phone, however, unable to reach. RD unable to obtain further nutrition-related history or complete nutrition-focused physical exam at this time.   ? ?No wt loss noted over the past 2 months. However, pt with anasarca, which is likely masking true weight loss as well as fat and muscle depletions.  ? ?Per MD notes, pt endorses poor oral intake. No meal completion data available to assess at this time. Pt is currently on a heart health duet; will liberalize to provide wider variety of meal selections in attempt to improve oral intake.  ? ?Medications reviewed and include folic acid and prednisone.   ? ?Labs reviewed: CBGS: 114 (inpatient orders for glycemic control are none).   ? ?Diet Order:   ?Diet Order   ? ?       ?  Diet Heart Room service appropriate? Yes; Fluid consistency: Thin  Diet effective now       ?  ? ?  ?  ? ?  ? ? ?EDUCATION NEEDS:  ? ?No education needs  have been identified at this time ? ?Skin:  Skin Assessment: Reviewed RN Assessment ? ?Last BM:  03/26/22 ? ?Height:  ? ?Ht Readings from Last 1 Encounters:  ?03/21/22 5\' 5"  (1.651 m)  ? ? ?Weight:  ? ?Wt Readings from Last 1 Encounters:  ?03/28/22 62.5 kg  ? ? ?Ideal Body Weight:  56.8 kg ? ?BMI:  Body mass index is 22.93 kg/m?. ? ?Estimated Nutritional Needs:  ? ?Kcal:  1700-1900 ? ?Protein:  85-100 grams ? ?Fluid:  > 1.7 L ? ? ? ?Loistine Chance, RD, LDN, CDCES ?Registered Dietitian II ?Certified Diabetes Care and Education Specialist ?Please refer to [facility] for RD and/or RD on-call/weekend/after hours pager  ?

## 2022-03-28 NOTE — Plan of Care (Signed)
  Problem: Clinical Measurements: Goal: Diagnostic test results will improve Outcome: Not Progressing   Problem: Coping: Goal: Level of anxiety will decrease Outcome: Not Progressing   

## 2022-03-29 DIAGNOSIS — N2889 Other specified disorders of kidney and ureter: Secondary | ICD-10-CM | POA: Diagnosis not present

## 2022-03-29 DIAGNOSIS — M3219 Other organ or system involvement in systemic lupus erythematosus: Secondary | ICD-10-CM | POA: Diagnosis not present

## 2022-03-29 LAB — CBC WITH DIFFERENTIAL/PLATELET
Abs Immature Granulocytes: 0.1 10*3/uL — ABNORMAL HIGH (ref 0.00–0.07)
Basophils Absolute: 0 10*3/uL (ref 0.0–0.1)
Basophils Relative: 0 %
Eosinophils Absolute: 0 10*3/uL (ref 0.0–0.5)
Eosinophils Relative: 0 %
HCT: 21.4 % — ABNORMAL LOW (ref 36.0–46.0)
Hemoglobin: 7.2 g/dL — ABNORMAL LOW (ref 12.0–15.0)
Immature Granulocytes: 1 %
Lymphocytes Relative: 8 %
Lymphs Abs: 1 10*3/uL (ref 0.7–4.0)
MCH: 27.4 pg (ref 26.0–34.0)
MCHC: 33.6 g/dL (ref 30.0–36.0)
MCV: 81.4 fL (ref 80.0–100.0)
Monocytes Absolute: 1 10*3/uL (ref 0.1–1.0)
Monocytes Relative: 8 %
Neutro Abs: 10.4 10*3/uL — ABNORMAL HIGH (ref 1.7–7.7)
Neutrophils Relative %: 83 %
Platelets: 230 10*3/uL (ref 150–400)
RBC: 2.63 MIL/uL — ABNORMAL LOW (ref 3.87–5.11)
RDW: 15.3 % (ref 11.5–15.5)
WBC: 12.5 10*3/uL — ABNORMAL HIGH (ref 4.0–10.5)
nRBC: 0 % (ref 0.0–0.2)

## 2022-03-29 LAB — RENAL FUNCTION PANEL
Albumin: 2.2 g/dL — ABNORMAL LOW (ref 3.5–5.0)
Anion gap: 3 — ABNORMAL LOW (ref 5–15)
BUN: 26 mg/dL — ABNORMAL HIGH (ref 6–20)
CO2: 24 mmol/L (ref 22–32)
Calcium: 7.8 mg/dL — ABNORMAL LOW (ref 8.9–10.3)
Chloride: 112 mmol/L — ABNORMAL HIGH (ref 98–111)
Creatinine, Ser: 1.01 mg/dL — ABNORMAL HIGH (ref 0.44–1.00)
GFR, Estimated: >60 mL/min (ref >60)
Glucose, Bld: 114 mg/dL — ABNORMAL HIGH (ref 70–99)
Phosphorus: 3.8 mg/dL (ref 2.5–4.6)
Potassium: 3.8 mmol/L (ref 3.5–5.1)
Sodium: 139 mmol/L (ref 135–145)

## 2022-03-29 MED ORDER — ADULT MULTIVITAMIN W/MINERALS CH: 1.0000 | ORAL_TABLET | Freq: Every day | ORAL | 0 refills | Status: AC

## 2022-03-29 MED ORDER — AMLODIPINE BESYLATE 5 MG PO TABS: 5.0000 mg | ORAL_TABLET | Freq: Every day | ORAL | 0 refills | Status: DC

## 2022-03-29 MED ORDER — FAMOTIDINE 20 MG PO TABS: 20.0000 mg | ORAL_TABLET | Freq: Two times a day (BID) | ORAL | 0 refills | Status: DC

## 2022-03-29 MED ORDER — TACROLIMUS 1 MG PO CAPS: 1.0000 mg | ORAL_CAPSULE | Freq: Two times a day (BID) | ORAL | 0 refills | Status: AC

## 2022-03-29 NOTE — Discharge Summary (Signed)
Physician Discharge Summary  ?Alexis Rice [id number] DOB: Jul 26, 2001 DOA: 03/26/2022 ? ?PCP: Bartholome Bill, MD ? ?Admit date: 03/26/2022 ?Discharge date: 03/29/2022 ? ?Admitted From: Home ?Disposition: Home ? ?Recommendations for Outpatient Follow-up:  ?Follow up with nephrology as scheduled next week. ?Please obtain BMP/CBC in one week ? ? ?Home Health: N/A ?Equipment/Devices: N/A ? ?Discharge Condition: Stable ?CODE STATUS: Full code ?Diet recommendation: Low-salt diet  ? ?Discharge summary ?21 year old with SLE and recently diagnosed lupus nephritis, hypertension who is currently on mycophenolate and prednisone presented to the ER with chest tightness and palpitations, episodic nausea and abdominal discomfort.  She arrived to ER with tachycardia heart rate 120.  100% room air.  Creatinine 1.2 slightly above the baseline.  WBC 14.3, hemoglobin 11.3 higher than baseline.  CT scan abdomen pelvis with anasarca, mild ascites and moderate left-sided pleural effusion.  Admitted due to symptomatic dehydration, intolerance to therapies. ?  ?  ?Lupus nephritis with anasarca: ?Recently started on treatment, currently on  ?prednisone 60 mg daily,  ?mycophenolate 500 mg twice daily. ?Started on tacrolimus 1 mg twice daily started in the hospital. ?She is already on Plaquenil. ?Questionable intolerance to mycophenolate, however she did well last 24 hours when he spaced with a meal. ?She has gastrointestinal disturbance, increased Pepcid to 20 mg twice daily.  Take medications with meal. ? Anasarca secondary to lupus nephritis class V predominating: Symptomatically treated.  Received 2 doses of IV albumin.   ?She will benefit with resuming diuretics, will resume torsemide today. ?Patient was on amlodipine 10 mg daily, not given in the hospital.  Blood pressure elevated today.  Will resume 5 mg daily.  Patient is scheduled to follow-up with nephrology next week for ongoing drug monitoring and treatment. ?  ? ?Discharge  Diagnoses:  ?Principal Problem: ?  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  Diarrhea ?  Anasarca ?  Essential hypertension ?  Chest pain/palpitation/anxiety ?  Pleural effusion on left ?  Palpitation ?  Elevated troponin ?  Anxiety ? ? ? ?Discharge Instructions ? ?Discharge Instructions   ? ? Call MD for:  persistant nausea and vomiting   Complete by: As directed ?  ? Diet - low sodium heart healthy   Complete by: As directed ?  ? Increase activity slowly   Complete by: As directed ?  ? ?  ? ?Allergies as of 03/29/2022   ?No Known Allergies ?  ? ?  ?Medication List  ?  ? ?STOP taking these medications   ? ?Lokelma 10 g Pack packet ?Generic drug: sodium zirconium cyclosilicate ?  ? ?  ? ?TAKE these medications   ? ?acetaminophen 500 MG tablet ?Commonly known as: TYLENOL ?Take by mouth. ?  ?amLODipine 5 MG tablet ?Commonly known as: NORVASC ?Take 1 tablet (5 mg total) by mouth daily. ?What changed:  ?medication strength ?how much to take ?  ?famotidine 20 MG tablet ?Commonly known as: PEPCID ?Take 1 tablet (20 mg total) by mouth 2 (two) times daily. ?What changed: when to take this ?  ?FOLIC ACID PO ?Take 1 tablet by mouth daily. ?  ?hydroxychloroquine 200 MG tablet ?Commonly known as: PLAQUENIL ?Take 200 mg by mouth daily. ?  ?multivitamin with minerals Tabs tablet ?Take 1 tablet by mouth daily. ?  ?mycophenolate 250 MG capsule ?Commonly known as: CELLCEPT ?Take 500 mg by mouth 2 (two) times daily. ?  ?predniSONE 20 MG tablet ?Commonly known as: DELTASONE ?Take 60 mg by mouth daily. ?  ?tacrolimus  1 MG capsule ?Commonly known as: PROGRAF ?Take 1 capsule (1 mg total) by mouth 2 (two) times daily. ?  ?torsemide 20 MG tablet ?Commonly known as: DEMADEX ?Take 20 mg by mouth daily. ?  ? ?  ? ? ?No Known Allergies ? ?Consultations: ?Nephrology ? ? ?Procedures/Studies: ?CT ABDOMEN PELVIS W CONTRAST ? ?Result Date: 03/27/2022 ?CLINICAL DATA:  Abdominal pain, acute, nonlocalized. Right lower quadrant  abdominal pain, diarrhea. Lupus. EXAM: CT ABDOMEN AND PELVIS WITH CONTRAST TECHNIQUE: Multidetector CT imaging of the abdomen and pelvis was performed using the standard protocol following bolus administration of intravenous contrast. RADIATION DOSE REDUCTION: This exam was performed according to the departmental dose-optimization program which includes automated exposure control, adjustment of the mA and/or kV according to patient size and/or use of iterative reconstruction technique. CONTRAST:  38mL OMNIPAQUE IOHEXOL 300 MG/ML  SOLN COMPARISON:  None. FINDINGS: Lower chest: At least moderate left pleural effusion. Cardiac size within normal limits. Hepatobiliary: No focal liver abnormality is seen. No gallstones, gallbladder wall thickening, or biliary dilatation. Pancreas: Unremarkable Spleen: Unremarkable Adrenals/Urinary Tract: The adrenal glands are unremarkable. The kidneys are normal in size and position. There is an unusual enhancement pattern of the kidneys bilaterally with relatively low attenuation of the subcapsular cortices which may relate to parenchymal edema. Preserved cortical thickness. No hydronephrosis. No enhancing intrarenal masses. No intrarenal or ureteral calculi. The bladder is unremarkable. Stomach/Bowel: Mild ascites. The stomach, small bowel, and large bowel are unremarkable. Appendix normal. No free intraperitoneal gas. Vascular/Lymphatic: No significant vascular findings are present. No enlarged abdominal or pelvic lymph nodes. Reproductive: 23 mm simple appearing cyst within the right ovary. No follow-up imaging is recommended in a patient of this age. The pelvic organs are otherwise unremarkable. Other: There is moderate diffuse subcutaneous body wall edema. No abdominal wall hernia. Musculoskeletal: No acute bone abnormality. No lytic or blastic bone lesion. IMPRESSION: Anasarca with mild ascites, diffuse body wall subcutaneous edema, and moderate left pleural effusion. Unusual  enhancement pattern of the kidneys most suggestive of an underlying inflammatory process with resultant cortical edema. This may relate to the patient's underlying connective tissue disorder and reflect changes of lupus nephritis. 2.3 cm right ovarian simple-appearing cyst. No follow-up imaging is recommended. Reference: JACR 2020 Feb;17(2):248-254 Electronically Signed   By: Fidela Salisbury M.D.   On: 03/27/2022 00:18  ? ?US Venous Img Lower Bilateral (DVT) ? ?Result Date: 03/27/2022 ?CLINICAL DATA:  Edema EXAM: BILATERAL LOWER EXTREMITY VENOUS DOPPLER ULTRASOUND TECHNIQUE: Gray-scale sonography with compression, as well as color and duplex ultrasound, were performed to evaluate the deep venous system(s) from the level of the common femoral vein through the popliteal and proximal calf veins. COMPARISON:  None. FINDINGS: VENOUS Normal compressibility of the common femoral, superficial femoral, and popliteal veins, as well as the visualized calf veins. Visualized portions of profunda femoral vein and great saphenous vein unremarkable. No filling defects to suggest DVT on grayscale or color Doppler imaging. Doppler waveforms show normal direction of venous flow, normal respiratory plasticity and response to augmentation. Limited views of the contralateral common femoral vein are unremarkable. OTHER Diffuse soft tissue edema Limitations: none IMPRESSION: 1. No deep venous thrombosis identified in the bilateral lower extremities. 2. Diffuse edema in the lower extremity soft tissues Electronically Signed   By: Ulyses Jarred M.D.   On: 03/27/2022 02:06   ?(Echo, Carotid, EGD, Colonoscopy, ERCP)  ? ? ?Subjective: Patient seen and examined.  She could not tolerate her medications last night.  No more diarrhea.  No more nausea or vomiting.  Epigastric pain has improved.  Intake has improved.  Mother at the bedside. ? ? ?Discharge Exam: ?Vitals:  ? 03/29/22 0410 03/29/22 0824  ?BP: (!) 133/97 (!) 138/107  ?Pulse:  83  ?Resp:  16   ?Temp:  98 ?F (36.7 ?C)  ?SpO2:    ? ?Vitals:  ? 03/29/22 0359 03/29/22 0410 03/29/22 0500 03/29/22 0824  ?BP: (!) 129/102 (!) 133/97  (!) 138/107  ?Pulse: 75   83  ?Resp: 20   16  ?Temp: 98 ?F (36.7 ?C)   98 ?F (3

## 2022-03-29 NOTE — Progress Notes (Signed)
?  [facility] ?Progress Note  ? ? ?Assessment/ Plan:   ?Alexis Rice is a/an 21 y.o. female with a past medical history SLE c/b LN who present w/ abdominal pain, diarrhea, and anasarca ?  ?Anasarca: 2/2 LN w/ class V predominating. Serum albumin not terrible at 2.5.  Largely driven by underlying lupus.   ?- IV albumin 25 % x 2 doses ?- holding diuretics ?  ?LN Class IV/V: still in initial treatment phase so not surprised she has not had significant improvement at this time. It has been difficult to uptitrate MMF due to GI symptoms ?-Crt is at baseline currently at 1.2 ?-Nausea/abd pain as below ?-Pred 60mg  daily ?-Cont MMF 500mg  BID, tolerating well ?- 1 mg BID tac started ?-cont home famotidine and plaquenil  ?- follow up with Dr Gean Quint- will send message to scheduler to help arrange ?  ?Nausea/Abd pain/Diarrhea:  ?- stool studies discontinued ?- CT scan OK 03/26/22 ?  ?Leukocytosis: most likely 2/2 steroids. CTM for further signs of infection ?  ?Troponin elevation: fairly low now normal. Defer any work up to primary ?  ?Anemia: hgb 11.3. Overall not concerning. CTM ?  ?HTN: bp acceptable. Hold norvasc for now ? ?  ? ?Subjective:   ? ?Looks great today!  Eager to go home, did great with meds last night and no more stomach upset.    ? ?Objective:   ?BP (!) 138/107 (BP Location: Left Arm)   Pulse 83   Temp 98 ?F (36.7 ?C) (Oral)   Resp 16   Wt 65.9 kg   LMP 03/16/2022 (Approximate)   SpO2 97%   BMI 24.18 kg/m?  ?No intake or output data in the 24 hours ending 03/29/22 1158 ? ?Weight change: 3.4 kg ? ?Physical Exam: ?Gen:NAD, sitting in bed ?HEENT: puffy face ?CVS: RRR ?Resp: clear ?Abd: soft, nontender ?Ext: 1+ anasarca ? ?Imaging: ?No results found. ? ?Labs: ?BMET ?Recent Labs  ?Lab 03/26/22 ?2204 03/28/22 ?0243 03/29/22 ?0034  ?NA 136 137 139  ?K 3.7 4.0 3.8  ?CL 105 109 112*  ?CO2 22 22 24   ?GLUCOSE 127* 123* 114*  ?BUN 36* 29* 26*  ?CREATININE 1.18* 1.05* 1.01*  ?CALCIUM 7.7* 7.5* 7.8*   ?PHOS  --  4.0 3.8  ? ?CBC ?Recent Labs  ?Lab 03/26/22 ?2204 03/28/22 ?0243 03/29/22 ?0034  ?WBC 14.3* 8.2 12.5*  ?NEUTROABS  --   --  10.4*  ?HGB 11.3* 7.5* 7.2*  ?HCT 34.0* 21.7* 21.4*  ?MCV 79.8* 80.4 81.4  ?PLT 400 243 230  ? ? ?Medications:   ? ? enoxaparin (LOVENOX) injection  40 mg Subcutaneous Q24H  ? famotidine  20 mg Oral BID  ? feeding supplement  237 mL Oral BID BM  ? folic acid  1 mg Oral Daily  ? hydroxychloroquine  200 mg Oral Daily  ? multivitamin with minerals  1 tablet Oral Daily  ? mycophenolate  500 mg Oral BID  ? predniSONE  60 mg Oral Q breakfast  ? tacrolimus  1 mg Oral BID  ? ? ? ? ?Madelon Lips, MD ?03/29/2022, 11:58 AM   ?

## 2022-03-29 NOTE — Progress Notes (Signed)
D/C instructions given, verbalized understanding.  D/C home stable. ?

## 2022-03-30 NOTE — Progress Notes (Signed)
White cell count is elevated due to prednisone use.  Anemia noted due to chronic disease.  Kidney functions are low and stable.  Other labs are consistent with lupus and will be discussed at the follow-up visit.  Please forward results to her nephrologist and PCP.

## 2022-03-30 NOTE — Progress Notes (Signed)
? ?Office Visit Note ? ?Patient: Alexis Rice             ?Date of Birth: 30-Aug-2001           ?MRN: [id number]             ?PCP: Bartholome Bill, MD ?Referring: No ref. provider found ?Visit Date: 04/08/2022 ?Occupation: @GUAROCC @ ? ?Subjective:  ?Medication management ? ?History of Present Illness: Alexis Rice is a 21 y.o. female with history of systemic lupus and lupus nephritis.  She was hospitalized on March 27, 2022 with chest tightness, palpitations, nausea and abdominal discomfort.  At the time CT of the abdomen showed anasarca, mild ascites and moderate left-sided pleural effusion.  She was admitted due to dehydration.  She received 2 doses of IV albumin 25%.  She has been experiencing GI side effects from CellCept.  She was recently placed on tacrolimus 1 mg twice daily.  He had an appointment with Dr. Candiss Norse yesterday.  She is overall feeling better.  She is a still on prednisone 60 mg p.o. daily.  She states Dr. Candiss Norse plans to start her on iron for iron deficiency anemia. ? ?Activities of Daily Living:  ?Patient reports morning stiffness for a few minutes.   ?Patient Denies nocturnal pain.  ?Difficulty dressing/grooming: Reports ?Difficulty climbing stairs: Reports ?Difficulty getting out of chair: Denies ?Difficulty using hands for taps, buttons, cutlery, and/or writing: Reports ? ?Review of Systems  ?Constitutional:  Positive for fatigue.  ?HENT:  Positive for mouth dryness. Negative for mouth sores and nose dryness.   ?Eyes:  Positive for dryness. Negative for pain and itching.  ?Respiratory:  Negative for shortness of breath and difficulty breathing.   ?Cardiovascular:  Positive for palpitations. Negative for chest pain.  ?Gastrointestinal:  Negative for blood in stool, constipation and diarrhea.  ?Endocrine: Negative for increased urination.  ?Genitourinary:  Negative for difficulty urinating.  ?Musculoskeletal:  Positive for joint swelling, myalgias, morning stiffness and myalgias. Negative for  joint pain, joint pain and muscle tenderness.  ?Skin:  Negative for color change, rash, redness and sensitivity to sunlight.  ?Allergic/Immunologic: Negative for susceptible to infections.  ?Neurological:  Negative for dizziness, numbness, headaches, memory loss and weakness.  ?Hematological:  Negative for bruising/bleeding tendency and swollen glands.  ?Psychiatric/Behavioral:  Positive for sleep disturbance. Negative for depressed mood and confusion. The patient is nervous/anxious.   ? ?PMFS History:  ?Patient Active Problem List  ? Diagnosis Date Noted  ? 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 Diarrhea 03/27/2022  ? Anasarca 03/27/2022  ? Chest pain/palpitation/anxiety 03/27/2022  ? Palpitation 03/27/2022  ? Elevated troponin 03/27/2022  ? Pleural effusion on left 03/27/2022  ? Anxiety 03/27/2022  ? Systemic lupus erythematosus ([place]) 03/21/2022  ? 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 High risk medication use 03/21/2022  ? Family history of lupus erythematosus 03/21/2022  ? Hx of migraines 03/21/2022  ? Essential hypertension 02/25/2022  ? Poor appetite 09/28/2013  ? Nausea   ?  ?Past Medical History:  ?Diagnosis Date  ? Collagen vascular disease ([place])   ? Hypertension   ? Kidney disease   ? Lupus ([place])   ? Nausea   ? Reduced Appetite  ?  ?Family History  ?Problem Relation Age of Onset  ? Ulcers Father   ? Asthma Sister   ? Lupus Sister   ? Cholelithiasis Maternal Aunt   ? ?Past Surgical History:  ?Procedure Laterality Date  ? RENAL BIOPSY  Left   ? ?Social History  ? ?Social History Narrative  ? 7th grade 2014-2015  ? ? ?There is no immunization history on file for this patient.  ? ?Objective: ?Vital Signs: BP (!) 136/100 (BP Location: Left Arm, Patient Position: Sitting, Cuff Size: Normal)   Pulse 90   Ht 5\' 5"  (1.651 m)   Wt 128 lb 9.6 oz (58.3 kg)   LMP 03/16/2022 (Approximate) Comment: neg preg test  BMI 21.40 kg/m?    ? ?Physical Exam ?Vitals and nursing note reviewed.  ?Constitutional:   ?   Appearance: She is well-developed.  ?HENT:  ?   Head: Normocephalic and atraumatic.  ?Eyes:  ?   Conjunctiva/sclera: Conjunctivae normal.  ?Cardiovascular:  ?   Rate and Rhythm: Normal rate and regular rhythm.  ?   Heart sounds: Normal heart sounds.  ?Pulmonary:  ?   Effort: Pulmonary effort is normal.  ?   Breath sounds: Normal breath sounds.  ?Abdominal:  ?   General: Bowel sounds are normal.  ?   Palpations: Abdomen is soft.  ?Musculoskeletal:  ?   Cervical back: Normal range of motion.  ?   Right lower leg: Edema present.  ?   Left lower leg: Edema present.  ?   Comments: Periorbital edema was noted.  ?Lymphadenopathy:  ?   Cervical: No cervical adenopathy.  ?Skin: ?   General: Skin is warm and dry.  ?   Capillary Refill: Capillary refill takes less than 2 seconds.  ?Neurological:  ?   Mental Status: She is alert and oriented to person, place, and time.  ?Psychiatric:     ?   Behavior: Behavior normal.  ?  ? ?Musculoskeletal Exam: C-spine thoracic and lumbar spine were in good range of motion.  Shoulder joints, elbow joints, wrist joints, MCPs PIPs and DIPs with good range of motion with no synovitis.  Hip joints and knee joints in good range of motion.  There was no tenderness over ankles or MTPs.  She had bilateral 4+ edema on her lower extremities. ? ?CDAI Exam: ?CDAI Score: -- ?Patient Global: --; Provider Global: -- ?Swollen: --; Tender: -- ?Joint Exam 04/08/2022  ? ?No joint exam has been documented for this visit  ? ?There is currently no information documented on the homunculus. Go to the Rheumatology activity and complete the homunculus joint exam. ? ?Investigation: ?No additional findings. ? ?Imaging: ?CT ABDOMEN PELVIS W CONTRAST ? ?Result Date: 03/27/2022 ?CLINICAL DATA:  Abdominal pain, acute, nonlocalized. Right lower quadrant abdominal pain, diarrhea. Lupus. EXAM: CT ABDOMEN AND PELVIS WITH CONTRAST TECHNIQUE: Multidetector  CT imaging of the abdomen and pelvis was performed using the standard protocol following bolus administration of intravenous contrast. RADIATION DOSE REDUCTION: This exam was performed according to the departmental dose-optimization program which includes automated exposure control, adjustment of the mA and/or kV according to patient size and/or use of iterative reconstruction technique. CONTRAST:  66mL OMNIPAQUE IOHEXOL 300 MG/ML  SOLN COMPARISON:  None. FINDINGS: Lower chest: At least moderate left pleural effusion. Cardiac size within normal limits. Hepatobiliary: No focal liver abnormality is seen. No gallstones, gallbladder wall thickening, or biliary dilatation. Pancreas: Unremarkable Spleen: Unremarkable Adrenals/Urinary Tract: The adrenal glands are unremarkable. The kidneys are normal in size and position. There is an unusual enhancement pattern of the kidneys bilaterally with relatively low attenuation of the subcapsular cortices which may relate to parenchymal edema. Preserved cortical thickness. No hydronephrosis. No enhancing intrarenal masses. No intrarenal or ureteral calculi. The bladder is unremarkable. Stomach/Bowel:  Mild ascites. The stomach, small bowel, and large bowel are unremarkable. Appendix normal. No free intraperitoneal gas. Vascular/Lymphatic: No significant vascular findings are present. No enlarged abdominal or pelvic lymph nodes. Reproductive: 23 mm simple appearing cyst within the right ovary. No follow-up imaging is recommended in a patient of this age. The pelvic organs are otherwise unremarkable. Other: There is moderate diffuse subcutaneous body wall edema. No abdominal wall hernia. Musculoskeletal: No acute bone abnormality. No lytic or blastic bone lesion. IMPRESSION: Anasarca with mild ascites, diffuse body wall subcutaneous edema, and moderate left pleural effusion. Unusual enhancement pattern of the kidneys most suggestive of an underlying inflammatory process with resultant  cortical edema. This may relate to the patient's underlying connective tissue disorder and reflect changes of lupus nephritis. 2.3 cm right ovarian simple-appearing cyst. No follow-up imaging is recommended.

## 2022-04-01 LAB — CULTURE, BLOOD (ROUTINE X 2)
Culture: NO GROWTH
Special Requests: NORMAL

## 2022-04-08 ENCOUNTER — Encounter: Payer: Self-pay | Admitting: Rheumatology

## 2022-04-08 ENCOUNTER — Ambulatory Visit: Payer: 59 | Admitting: Rheumatology

## 2022-04-08 VITALS — BP 136/100 | HR 90 | Ht 65.0 in | Wt 128.6 lb

## 2022-04-08 DIAGNOSIS — N033 Chronic nephritic syndrome with diffuse mesangial proliferative glomerulonephritis: Secondary | ICD-10-CM | POA: Diagnosis not present

## 2022-04-08 DIAGNOSIS — M25522 Pain in left elbow: Secondary | ICD-10-CM

## 2022-04-08 DIAGNOSIS — G8929 Other chronic pain: Secondary | ICD-10-CM

## 2022-04-08 DIAGNOSIS — M25461 Effusion, right knee: Secondary | ICD-10-CM

## 2022-04-08 DIAGNOSIS — Z79899 Other long term (current) drug therapy: Secondary | ICD-10-CM | POA: Diagnosis not present

## 2022-04-08 DIAGNOSIS — M25521 Pain in right elbow: Secondary | ICD-10-CM | POA: Diagnosis not present

## 2022-04-08 DIAGNOSIS — M25562 Pain in left knee: Secondary | ICD-10-CM

## 2022-04-08 DIAGNOSIS — I10 Essential (primary) hypertension: Secondary | ICD-10-CM

## 2022-04-08 DIAGNOSIS — M25561 Pain in right knee: Secondary | ICD-10-CM

## 2022-04-08 DIAGNOSIS — M3214 Glomerular disease in systemic lupus erythematosus: Secondary | ICD-10-CM | POA: Diagnosis not present

## 2022-04-08 DIAGNOSIS — Z8669 Personal history of other diseases of the nervous system and sense organs: Secondary | ICD-10-CM

## 2022-04-08 DIAGNOSIS — Z840 Family history of diseases of the skin and subcutaneous tissue: Secondary | ICD-10-CM

## 2022-04-17 ENCOUNTER — Telehealth: Payer: Self-pay | Admitting: *Deleted

## 2022-04-17 NOTE — Telephone Encounter (Signed)
Labs received from:[facility] ? ?Drawn on:04/04/2022 ? ?Reviewed by:Dr. Pollyann Savoy  ? ?Labs drawn:UA, CMP ? ?Results:UA: Protein 3+,  ?        Occult Blood 2+ ? BUN : 26 ? Creat. 1.05 ? Chloride 113 ? Calcium 8.1 ? Protein, Total 3.9 ? Albumin 2.5 ? Globulin, Total 1.4  ?

## 2022-04-21 ENCOUNTER — Ambulatory Visit: Payer: 59 | Admitting: Rheumatology

## 2022-05-02 ENCOUNTER — Emergency Department (HOSPITAL_BASED_OUTPATIENT_CLINIC_OR_DEPARTMENT_OTHER): Payer: 59 | Admitting: Radiology

## 2022-05-02 ENCOUNTER — Other Ambulatory Visit: Payer: Self-pay

## 2022-05-02 ENCOUNTER — Emergency Department (HOSPITAL_BASED_OUTPATIENT_CLINIC_OR_DEPARTMENT_OTHER)
Admission: EM | Admit: 2022-05-02 | Discharge: 2022-05-02 | Disposition: A | Discharge status: Home / Self Care | Payer: 59 | Attending: Emergency Medicine | Admitting: Emergency Medicine

## 2022-05-02 ENCOUNTER — Encounter: Payer: Self-pay | Admitting: Nephrology

## 2022-05-02 ENCOUNTER — Encounter (HOSPITAL_BASED_OUTPATIENT_CLINIC_OR_DEPARTMENT_OTHER): Payer: Self-pay

## 2022-05-02 DIAGNOSIS — E860 Dehydration: Secondary | ICD-10-CM | POA: Insufficient documentation

## 2022-05-02 DIAGNOSIS — R5383 Other fatigue: Secondary | ICD-10-CM | POA: Insufficient documentation

## 2022-05-02 DIAGNOSIS — R000 Tachycardia, unspecified: Secondary | ICD-10-CM | POA: Insufficient documentation

## 2022-05-02 DIAGNOSIS — M3214 Glomerular disease in systemic lupus erythematosus: Secondary | ICD-10-CM | POA: Insufficient documentation

## 2022-05-02 LAB — URINALYSIS, ROUTINE W REFLEX MICROSCOPIC
Bilirubin Urine: NEGATIVE
Glucose, UA: NEGATIVE mg/dL
Ketones, ur: NEGATIVE mg/dL
Nitrite: NEGATIVE
Protein, ur: >300 mg/dL — AB
Specific Gravity, Urine: 1.018 (ref 1.005–1.030)
pH: 5.5 (ref 5.0–8.0)

## 2022-05-02 LAB — HEPATIC FUNCTION PANEL
ALT: 15 U/L (ref 0–44)
AST: 12 U/L — ABNORMAL LOW (ref 15–41)
Albumin: 3.3 g/dL — ABNORMAL LOW (ref 3.5–5.0)
Alkaline Phosphatase: 39 U/L (ref 38–126)
Bilirubin, Direct: <0.1 mg/dL (ref 0.0–0.2)
Total Bilirubin: 0.2 mg/dL — ABNORMAL LOW (ref 0.3–1.2)
Total Protein: 5.3 g/dL — ABNORMAL LOW (ref 6.5–8.1)

## 2022-05-02 LAB — CBC
HCT: 33 % — ABNORMAL LOW (ref 36.0–46.0)
Hemoglobin: 10.8 g/dL — ABNORMAL LOW (ref 12.0–15.0)
MCH: 27.2 pg (ref 26.0–34.0)
MCHC: 32.7 g/dL (ref 30.0–36.0)
MCV: 83.1 fL (ref 80.0–100.0)
Platelets: 326 10*3/uL (ref 150–400)
RBC: 3.97 MIL/uL (ref 3.87–5.11)
RDW: 15.5 % (ref 11.5–15.5)
WBC: 12.4 10*3/uL — ABNORMAL HIGH (ref 4.0–10.5)
nRBC: 0.2 % (ref 0.0–0.2)

## 2022-05-02 LAB — BASIC METABOLIC PANEL
Anion gap: 9 (ref 5–15)
BUN: 22 mg/dL — ABNORMAL HIGH (ref 6–20)
CO2: 26 mmol/L (ref 22–32)
Calcium: 8.6 mg/dL — ABNORMAL LOW (ref 8.9–10.3)
Chloride: 102 mmol/L (ref 98–111)
Creatinine, Ser: 1.13 mg/dL — ABNORMAL HIGH (ref 0.44–1.00)
GFR, Estimated: >60 mL/min (ref >60)
Glucose, Bld: 109 mg/dL — ABNORMAL HIGH (ref 70–99)
Potassium: 3.9 mmol/L (ref 3.5–5.1)
Sodium: 137 mmol/L (ref 135–145)

## 2022-05-02 LAB — IRON AND TIBC
Iron: 68 ug/dL (ref 28–170)
Saturation Ratios: 25 % (ref 10.4–31.8)
TIBC: 273 ug/dL (ref 250–450)
UIBC: 205 ug/dL

## 2022-05-02 LAB — FOLATE: Folate: 36 ng/mL (ref >5.9)

## 2022-05-02 LAB — FERRITIN: Ferritin: 50 ng/mL (ref 11–307)

## 2022-05-02 LAB — VITAMIN B12: Vitamin B-12: 308 pg/mL (ref 180–914)

## 2022-05-02 LAB — TROPONIN I (HIGH SENSITIVITY): Troponin I (High Sensitivity): 6 ng/L (ref <18)

## 2022-05-02 LAB — PREGNANCY, URINE: Preg Test, Ur: NEGATIVE

## 2022-05-02 LAB — RETICULOCYTES
Immature Retic Fract: 13.4 % (ref 2.3–15.9)
RBC.: 3.92 MIL/uL (ref 3.87–5.11)
Retic Count, Absolute: 131.7 10*3/uL (ref 19.0–186.0)
Retic Ct Pct: 3.4 % — ABNORMAL HIGH (ref 0.4–3.1)

## 2022-05-02 MED ORDER — SODIUM CHLORIDE 0.9 % IV BOLUS
1000.0000 mL | Freq: Once | INTRAVENOUS | Status: AC
  Administered 2022-05-02: 1000 mL via INTRAVENOUS

## 2022-05-02 NOTE — Discharge Instructions (Addendum)
You were seen in the emergency department today for a fast heart rate and weakness. ? ?As we discussed based on your lab work and your response to IV fluids, I think that you are likely dehydrated.  This could have been related to the diuretic medicine that you are on. ? ?I have talked to your nephrologist, and we agreed to prophylactically start you on some antibiotics.   ? ?We will use the Bactrim that you are already taking, but I'd like you to start taking this differently.  Please take this twice daily for the next 5 days.   ? ?After this course, you can switch back to your Monday/Wednesday/Friday dosing. ? ?Continue to monitor how you're doing and return to the ER for new or worsening symptoms.  ?

## 2022-05-02 NOTE — ED Notes (Signed)
Pt. Feels generally weak and tired. Was diagnosed with Lupus in February .  ?

## 2022-05-02 NOTE — ED Triage Notes (Signed)
Pt c/o heart rapid HR, light headed, weakness, and tired feeling for a few days. Pt with hx of anemia and was sent here by her kidney Dr.  ?

## 2022-05-02 NOTE — Progress Notes (Addendum)
Today, patient presented to our office for follow up on lupus nephritis. Has been experiencing excessive fatigue/weakness (has been in bed mostly), high heart rate, feeling more pale and cold for the latter part of this week. Suspecting this is related to worsening anemia. Her last Hgb at our office was 9.1 on 04/04/2022 and we have been trying to set her up with iron infusions however insurance has been a barrier. Given how poorly she is feeling, she will be going to [facility] ER for further evaluation which I agree with. We are obtaining our routine labs here however probably will not get results in time. Of note, she denied any hematemesis, melena, black/tarry stools. If her Hgb is lower than what it was when checked last, then would recommend a transfusion until we can get her iron infusion situation all sorted out. Please call covering nephrologist with any questions/concerns. Can also send me an Epic chat message if needed. ?Appreciate assistance from ER. ? ?Anthony Sar, MD ?[facility] ?

## 2022-05-02 NOTE — ED Provider Notes (Signed)
?[facility] EMERGENCY DEPT ?Provider Note ? ? ?CSN: [id number] ?Arrival date & time: 05/02/22  1106 ? ?  ? ?History ? ?Chief Complaint  ?Patient presents with  ? Anemia  ? ? ?Alexis SiasMegan Piano is a 21 y.o. female with history of kidney disease related to lupus nephritis who presents the emergency department complaining of fatigue, tachycardia, and chills for several days.  Patient saw her nephrologist this morning, and they were concerned with a history of anemia and a fast heart rate that she may require blood or iron transfusions.  Patient states that she was admitted to the hospital about a month ago and states the symptoms that brought her and then were very similar today, however she does not feel that she is retaining fluid as she was then.  Reports several medication changes by her nephrologist in the past several weeks since hospital discharge.  ? ?The history is provided by the patient and a parent.  ?Anemia ?Pertinent negatives include no shortness of breath.  ? ?  ? ?Home Medications ?Prior to Admission medications   ?Medication Sig Start Date End Date Taking? Authorizing Provider  ?acetaminophen (TYLENOL) 500 MG tablet Take by mouth.    [provider]  ?amLODipine (NORVASC) 5 MG tablet Take 1 tablet (5 mg total) by mouth daily. 03/29/22 04/28/22  Dorcas CarrowGhimire, Kuber, MD  ?famotidine (PEPCID) 20 MG tablet Take 1 tablet (20 mg total) by mouth 2 (two) times daily. 03/29/22 04/28/22  Dorcas CarrowGhimire, Kuber, MD  ?FOLIC ACID PO Take 1 tablet by mouth daily.    [provider]  ?hydroxychloroquine (PLAQUENIL) 200 MG tablet Take 200 mg by mouth daily. 02/26/22   [provider]  ?mycophenolate (CELLCEPT) 250 MG capsule Take 500 mg by mouth 2 (two) times daily. 02/26/22   [provider]  ?predniSONE (DELTASONE) 20 MG tablet Take 60 mg by mouth daily. 02/26/22   [provider]  ?torsemide (DEMADEX) 20 MG tablet Take 20 mg by mouth daily.    [provider]  ?   ? ?Allergies     ?Patient has no known allergies.   ? ?Review of Systems   ?Review of Systems  ?Constitutional:  Positive for fatigue.  ?Respiratory:  Negative for shortness of breath.   ?Cardiovascular:  Positive for palpitations. Negative for leg swelling.  ?Gastrointestinal:  Negative for abdominal distention, blood in stool, diarrhea, nausea and vomiting.  ?Genitourinary:  Negative for dysuria, flank pain, hematuria and urgency.  ?Neurological:  Positive for light-headedness.  ?All other systems reviewed and are negative. ? ?Physical Exam ?Updated Vital Signs ?BP (!) 134/92   Pulse (!) 103   Temp 98.2 ?F (36.8 ?C)   Resp 16   Ht 5\' 5"  (1.651 m)   Wt 47.2 kg   LMP 04/23/2022   SpO2 100%   BMI 17.31 kg/m?  ?Physical Exam ?Vitals and nursing note reviewed.  ?Constitutional:   ?   Appearance: Normal appearance.  ?HENT:  ?   Head: Normocephalic and atraumatic.  ?Eyes:  ?   Conjunctiva/sclera: Conjunctivae normal.  ?Cardiovascular:  ?   Rate and Rhythm: Normal rate and regular rhythm.  ?Pulmonary:  ?   Effort: Pulmonary effort is normal. No respiratory distress.  ?   Breath sounds: Normal breath sounds.  ?Abdominal:  ?   General: There is no distension.  ?   Palpations: Abdomen is soft.  ?   Tenderness: There is no abdominal tenderness.  ?Musculoskeletal:  ?   Right lower leg: No edema.  ?  Left lower leg: No edema.  ?Skin: ?   General: Skin is warm and dry.  ?Neurological:  ?   General: No focal deficit present.  ?   Mental Status: She is alert.  ? ? ?ED Results / Procedures / Treatments   ?Labs ?(all labs ordered are listed, but only abnormal results are displayed) ?Labs Reviewed  ?URINALYSIS, ROUTINE W REFLEX MICROSCOPIC - Abnormal; Notable for the following components:  ?    Result Value  ? APPearance HAZY (*)   ? Hgb urine dipstick LARGE (*)   ? Protein, ur >300 (*)   ? Leukocytes,Ua MODERATE (*)   ? Bacteria, UA FEW (*)   ? All other components within normal limits  ?BASIC METABOLIC PANEL - Abnormal; Notable for the  following components:  ? Glucose, Bld 109 (*)   ? BUN 22 (*)   ? Creatinine, Ser 1.13 (*)   ? Calcium 8.6 (*)   ? All other components within normal limits  ?CBC - Abnormal; Notable for the following components:  ? WBC 12.4 (*)   ? Hemoglobin 10.8 (*)   ? HCT 33.0 (*)   ? All other components within normal limits  ?HEPATIC FUNCTION PANEL - Abnormal; Notable for the following components:  ? Total Protein 5.3 (*)   ? Albumin 3.3 (*)   ? AST 12 (*)   ? Total Bilirubin 0.2 (*)   ? All other components within normal limits  ?RETICULOCYTES - Abnormal; Notable for the following components:  ? Retic Ct Pct 3.4 (*)   ? All other components within normal limits  ?URINE CULTURE  ?PREGNANCY, URINE  ?FERRITIN  ?VITAMIN B12  ?FOLATE  ?IRON AND TIBC  ?TROPONIN I (HIGH SENSITIVITY)  ? ? ?EKG ?EKG Interpretation ? ?Date/Time:  Friday May 02 2022 11:37:48 EDT ?Ventricular Rate:  115 ?PR Interval:  120 ?QRS Duration: 86 ?QT Interval:  298 ?QTC Calculation: 412 ?R Axis:   81 ?Text Interpretation: Sinus tachycardia Otherwise normal ECG When compared with ECG of 26-Mar-2022 22:09, QRS voltage has increased Confirmed by Meridee Score [phone]) on 05/02/2022 11:41:08 AM ? ?Radiology ?DG Chest 2 View ? ?Result Date: 05/02/2022 ?CLINICAL DATA:  Tachycardia, history of pleural effusion related to lupus EXAM: CHEST - 2 VIEW COMPARISON:  February 2023 FINDINGS: The heart size and mediastinal contours are within normal limits. Both lungs are clear. No pleural effusion. The visualized skeletal structures are unremarkable. IMPRESSION: No active cardiopulmonary disease. Electronically Signed   By: Guadlupe Spanish M.D.   On: 05/02/2022 13:19   ? ?Procedures ?Procedures  ? ? ?Medications Ordered in ED ?Medications  ?sodium chloride 0.9 % bolus 1,000 mL (1,000 mLs Intravenous New Bag/Given 05/02/22 1413)  ? ? ?ED Course/ Medical Decision Making/ A&P ?  ?                        ?Medical Decision Making ?Amount and/or Complexity of Data Reviewed ?Labs:  ordered. ? ? ?This patient is a 21 y.o. female who presents to the ED for concern of tachycardia, this involves an extensive number of treatment options, and is a complaint that carries with it a high risk of complications and morbidity. The emergent differential diagnosis prior to evaluation includes, but is not limited to,  Pain/ Anger/Anxiety, Fever, Sepsis, Anemia, Drug/Etoh intoxication or withdrawal, Hyperthyroidism, PE, CHF, Tamponade, valvular disease, hyper/hypoglycemia, MI, Pheochromocytoma. This is not an exhaustive differential.  ? ?Past Medical History / Co-morbidities / Social History: ?Lupus nephritis  with anasarca, HTN ? ?Additional history: ?Chart reviewed. Pertinent results include: Pt was admitted on 4/6 for anasarca, pleural effusion, and elevated troponins likely related to kidney disease associated with lupus nephritis.  ? ?Nephrology visit from earlier today (Dr. Thedore Mins with [facility]) includes that patient was seen today for excessive fatigue, weakness, tachycardia and chills.  They were concerned this could be related to worsening anemia, as her last hemoglobin was 9.1 on 4/14.  They state that they have been trying to set her up with iron infusions, but insurance has been a barrier. ? ?Physical Exam: ?Physical exam performed. The pertinent findings include: Patient in no acute distress.  Tachycardic to 105.  Abdomen soft.  No leg swelling. ? ?Lab Tests: ?I ordered, and personally interpreted labs.  The pertinent results include:  ?Hemoglobin 10.8 (compared to 7.2 one month ago), leukocytosis of 12,400. Possibly related to chronic steroid use.  Creatinine 1.13, mildly elevated compared to prior.  Albumin 3.3, improved compared to prior. Urinalysis positive for large hemoglobin, moderate leukocytes and >300 protein. Pregnancy negative.  Anemia panel pending.  Urine culture sent.  Troponin of 6. ?  ?Cardiac Monitoring:  ?The patient was maintained on a cardiac monitor.  My attending  physician Dr. Charm Barges viewed and interpreted the cardiac monitored which showed an underlying rhythm of: sinus tachycardia with rate of 115. I agree with this interpretation.  ?  ?Medications: ?I ordered medication includi

## 2022-05-03 LAB — URINE CULTURE: Culture: <10000 — AB

## 2022-05-28 NOTE — Progress Notes (Unsigned)
Office Visit Note  Patient: Alexis Rice             Date of Birth: Dec 23, 2000           MRN: [id number]             PCP: Bartholome Bill, MD Referring: Bartholome Bill, MD Visit Date: 06/10/2022 Occupation: @GUAROCC @  Subjective:  Medication management  History of Present Illness: Alexis Rice is a 21 y.o. female with history of systemic lupus and lupus nephritis. She is taking MMF 5 mg p.o. BID, HCQ 200 mg po qd, tacrolimus 1mg  po bid, prednisone 20 mg 3 times daily.  Patient was evaluated by Dr. Candiss Norse on May 02, 2022.  At the time she presented with excessive fatigue and weakness.  She also was tachycardic.  Dr. Candiss Norse thought the tachycardia was due to anemia.  She was sent to the emergency room.  In the emergency room an EKG was performed.  The patient states that she was told that she was dehydrated due to torsemide.  No blood transfusion was required.  Her fatigue improved over time.  She states since her last visit she was a started on Benlysta a month ago.  Prograf was discontinued.  She also had a urinary tract infection which was treated with Bactrim for 5 days and then she was placed on Bactrim prophylaxis.  She states she has felt better since her last emergency room visit.  Her fatigue has resolved.  She is also gained some weight with improvement in her appetite.  She denies any joint pain or joint swelling.  She denies oral ulcers, nasal ulcers, malar rash, photosensitivity, raynaud's phenominon, lymphadenopathy or inflammatory arthritis.  Activities of Daily Living:  Patient reports morning stiffness for 0 minute.   Patient Denies nocturnal pain.  Difficulty dressing/grooming: Denies Difficulty climbing stairs: Denies Difficulty getting out of chair: Denies Difficulty using hands for taps, buttons, cutlery, and/or writing: Denies  Review of Systems  Constitutional:  Positive for weight gain. Negative for fatigue.  HENT:  Negative for mouth sores and mouth dryness.    Eyes:  Negative for dryness.  Respiratory:  Negative for difficulty breathing.   Cardiovascular:  Negative for chest pain and palpitations.  Gastrointestinal:  Negative for abdominal pain, blood in stool and diarrhea.  Endocrine: Negative for increased urination.  Genitourinary:  Negative for painful urination.  Musculoskeletal:  Negative for joint pain, joint pain, myalgias, morning stiffness and myalgias.  Skin:  Negative for color change, rash, hair loss and sensitivity to sunlight.  Hematological:  Negative for swollen glands.  Psychiatric/Behavioral:  Negative for depressed mood and sleep disturbance. The patient is not nervous/anxious.     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   Diarrhea 03/27/2022   Anasarca 03/27/2022   Chest pain/palpitation/anxiety 03/27/2022   Palpitation 03/27/2022   Elevated troponin 03/27/2022   Pleural effusion on left 03/27/2022   Anxiety 03/27/2022   Systemic lupus erythematosus ([place]) 03/21/2022   Chronic (diffuse) proliferative glomerulonephritis Class IV, membranous glomerulopathy classV 03/21/2022   High risk medication use 03/21/2022   Family history of lupus erythematosus 03/21/2022   Hx of migraines 03/21/2022   Essential hypertension 02/25/2022   Poor appetite 09/28/2013   Nausea     Past Medical History:  Diagnosis Date   Collagen vascular disease ([place])    Hypertension    Kidney disease    stage 2 CKD  Lupus ([place])    Nausea    Reduced Appetite    Family History  Problem Relation Age of Onset   Ulcers Father    Asthma Sister    Lupus Sister    Cholelithiasis Maternal Aunt    Past Surgical History:  Procedure Laterality Date   RENAL BIOPSY Left    Social History   Social History Narrative   7th grade 2014-2015    There is no immunization history on file for this patient.   Objective: Vital Signs: BP (!) 137/101 (BP Location: Left  Arm, Patient Position: Sitting, Cuff Size: Small)   Pulse 99   Resp 12   Ht 5\' 5"  (1.651 m)   Wt 117 lb 9.6 oz (53.3 kg)   BMI 19.57 kg/m    Physical Exam Vitals and nursing note reviewed.  Constitutional:      Appearance: She is well-developed.  HENT:     Head: Normocephalic and atraumatic.  Eyes:     Conjunctiva/sclera: Conjunctivae normal.  Cardiovascular:     Rate and Rhythm: Normal rate and regular rhythm.     Heart sounds: Normal heart sounds.  Pulmonary:     Effort: Pulmonary effort is normal.     Breath sounds: Normal breath sounds.  Abdominal:     General: Bowel sounds are normal.     Palpations: Abdomen is soft.  Musculoskeletal:     Cervical back: Normal range of motion.  Lymphadenopathy:     Cervical: No cervical adenopathy.  Skin:    General: Skin is warm and dry.     Capillary Refill: Capillary refill takes less than 2 seconds.     Comments: She had stretch marks on bilateral lower extremities.  No edema was noted.  Neurological:     Mental Status: She is alert and oriented to person, place, and time.  Psychiatric:        Behavior: Behavior normal.      Musculoskeletal Exam: C-spine thoracic and lumbar spine were in good range of motion.  Shoulder joints, elbow joints, wrist joints, MCPs PIPs and DIPs with good range of motion with no synovitis.  Hip joints, knee joints, ankles, MTPs and PIPs with good range of motion with no synovitis.  CDAI Exam: CDAI Score: -- Patient Global: --; Provider Global: -- Swollen: --; Tender: -- Joint Exam 06/10/2022   No joint exam has been documented for this visit   There is currently no information documented on the homunculus. Go to the Rheumatology activity and complete the homunculus joint exam.  Investigation: No additional findings.  Imaging: No results found.  Recent Labs: Lab Results  Component Value Date   WBC 12.4 (H) 05/02/2022   HGB 10.8 (L) 05/02/2022   PLT 326 05/02/2022   NA 137 05/02/2022    K 3.9 05/02/2022   CL 102 05/02/2022   CO2 26 05/02/2022   GLUCOSE 109 (H) 05/02/2022   BUN 22 (H) 05/02/2022   CREATININE 1.13 (H) 05/02/2022   BILITOT 0.2 (L) 05/02/2022   ALKPHOS 39 05/02/2022   AST 12 (L) 05/02/2022   ALT 15 05/02/2022   PROT 5.3 (L) 05/02/2022   ALBUMIN 3.3 (L) 05/02/2022   CALCIUM 8.6 (L) 05/02/2022    Speciality Comments: No specialty comments available.  Procedures:  No procedures performed Allergies: Patient has no known allergies.   Assessment / Plan:     Visit Diagnoses: SLE:+ANA,+ds,RNP,+Ro,+Sm,lowC3, fatigue, arthralgias, malar rash, DPGN -patient is clinically doing much better on the combination of CellCept, hydroxychloroquine  and Benlysta.  She is currently on prednisone 20 mg p.o. daily.  She has been followed closely by Dr. Candiss Norse.  She had recent episode of tachycardia and fatigue which according to the patient was related to the use of torsemide.  Notes from Dr. Candiss Norse and emergency room visit were reviewed.  She is feeling better with improvement in the fatigue.  She states she has gained some weight as her appetite returned.  We will obtain labs today to monitor for disease process.  Plan: Anti-DNA antibody, double-stranded, C3 and C4, Sedimentation rate  Chronic (diffuse) proliferative glomerulonephritis Class IV, membranous glomerulopathy classV - Followed by Dr. Gean Quint.  She had generalized anasarca and was treated with torsemide.  No edema was noted today.  High risk medication use - MMF 500 mg p.o. BID,HCQ 200 mg po qd, Benlysta 200 mg subcutaneous, prednisone 20 mg p.o. daily and Bactrim prophylaxis.  She is not on any contraceptives, not sexually active.  - Plan: COMPLETE METABOLIC PANEL WITH GFR, CBC with Differential/Platelet today.  She was advised to get labs every 3 months.  Information for immunization was placed in the AVS.  Effusion, right knee-resolved.  No synovitis was noted in any of the joints today.  Family history of  lupus erythematosus-Sister  Essential hypertension - Amlodipine 5 mg daily.  Her blood pressure is still elevated.  Advised to monitor blood pressure closely.  She states she will follow-up with Dr. Candiss Norse regarding adjustment in her medication.  Dietary modifications were discussed.  Hx of migraines  Orders: Orders Placed This Encounter  Procedures   COMPLETE METABOLIC PANEL WITH GFR   CBC with Differential/Platelet   Anti-DNA antibody, double-stranded   C3 and C4   Sedimentation rate   No orders of the defined types were placed in this encounter.    Follow-Up Instructions: Return in about 3 months (around 09/10/2022) for Systemic lupus.   Bo Merino, MD  Note - This record has been created using Editor, commissioning.  Chart creation errors have been sought, but may not always  have been located. Such creation errors do not reflect on  the standard of medical care.

## 2022-06-10 ENCOUNTER — Encounter: Payer: Self-pay | Admitting: Rheumatology

## 2022-06-10 ENCOUNTER — Ambulatory Visit (INDEPENDENT_AMBULATORY_CARE_PROVIDER_SITE_OTHER): Payer: 59 | Admitting: Rheumatology

## 2022-06-10 VITALS — BP 137/101 | HR 99 | Resp 12 | Ht 65.0 in | Wt 117.6 lb

## 2022-06-10 DIAGNOSIS — N033 Chronic nephritic syndrome with diffuse mesangial proliferative glomerulonephritis: Secondary | ICD-10-CM | POA: Diagnosis not present

## 2022-06-10 DIAGNOSIS — M25461 Effusion, right knee: Secondary | ICD-10-CM

## 2022-06-10 DIAGNOSIS — I10 Essential (primary) hypertension: Secondary | ICD-10-CM

## 2022-06-10 DIAGNOSIS — M3214 Glomerular disease in systemic lupus erythematosus: Secondary | ICD-10-CM

## 2022-06-10 DIAGNOSIS — Z8669 Personal history of other diseases of the nervous system and sense organs: Secondary | ICD-10-CM

## 2022-06-10 DIAGNOSIS — Z79899 Other long term (current) drug therapy: Secondary | ICD-10-CM | POA: Diagnosis not present

## 2022-06-10 DIAGNOSIS — Z840 Family history of diseases of the skin and subcutaneous tissue: Secondary | ICD-10-CM

## 2022-06-10 DIAGNOSIS — M25521 Pain in right elbow: Secondary | ICD-10-CM

## 2022-06-10 DIAGNOSIS — G8929 Other chronic pain: Secondary | ICD-10-CM

## 2022-06-10 NOTE — Patient Instructions (Signed)
Standing Labs We placed an order today for your standing lab work.   Please have your standing labs drawn in September and every 3 months  If possible, please have your labs drawn 2 weeks prior to your appointment so that the provider can discuss your results at your appointment.  Please note that you may see your imaging and lab results in MyChart before we have reviewed them. We may be awaiting multiple results to interpret others before contacting you. Please allow our office up to 72 hours to thoroughly review all of the results before contacting the office for clarification of your results.  We have open lab daily: Monday through Thursday from 1:30-4:30 PM and Friday from 1:30-4:00 PM at the office of Dr. Jaheim Canino, [facility].   Please be advised, all patients with office appointments requiring lab work will take precedent over walk-in lab work.  If possible, please come for your lab work on Monday and Friday afternoons, as you may experience shorter wait times. The office is located at [address], [address], [place], [place] [address] No appointment is necessary.   Labs are drawn by [facility]. Please bring your co-pay at the time of your lab draw.  You may receive a bill from [facility] for your lab work.  Please note if you are on Hydroxychloroquine and and an order has been placed for a Hydroxychloroquine level, you will need to have it drawn 4 hours or more after your last dose.  If you wish to have your labs drawn at another location, please call the office 24 hours in advance to send orders.  If you have any questions regarding directions or hours of operation,  please call [phone].   As a reminder, please drink plenty of water prior to coming for your lab work. Thanks!   Vaccines You are taking a medication(s) that can suppress your immune system.  The following immunizations are recommended: Flu annually Covid-19  Td/Tdap (tetanus, diphtheria,  pertussis) every 10 years Pneumonia (Prevnar 15 then Pneumovax 23 at least 1 year apart.  Alternatively, can take Prevnar 20 without needing additional dose) Shingrix: 2 doses from 4 weeks to 6 months apart  Please check with your PCP to make sure you are up to date.  

## 2022-06-13 LAB — CBC WITH DIFFERENTIAL/PLATELET
Absolute Monocytes: 799 cells/uL (ref 200–950)
Basophils Absolute: 9 cells/uL (ref 0–200)
Basophils Relative: 0.1 %
Eosinophils Absolute: 9 cells/uL — ABNORMAL LOW (ref 15–500)
Eosinophils Relative: 0.1 %
HCT: 33.8 % — ABNORMAL LOW (ref 35.0–45.0)
Hemoglobin: 11.1 g/dL — ABNORMAL LOW (ref 11.7–15.5)
Lymphs Abs: 2068 cells/uL (ref 850–3900)
MCH: 29.4 pg (ref 27.0–33.0)
MCHC: 32.8 g/dL (ref 32.0–36.0)
MCV: 89.7 fL (ref 80.0–100.0)
MPV: 8.8 fL (ref 7.5–12.5)
Monocytes Relative: 8.5 %
Neutro Abs: 6514 cells/uL (ref 1500–7800)
Neutrophils Relative %: 69.3 %
Platelets: 313 10*3/uL (ref 140–400)
RBC: 3.77 10*6/uL — ABNORMAL LOW (ref 3.80–5.10)
RDW: 13.9 % (ref 11.0–15.0)
Total Lymphocyte: 22 %
WBC: 9.4 10*3/uL (ref 3.8–10.8)

## 2022-06-13 LAB — ANTI-DNA ANTIBODY, DOUBLE-STRANDED: ds DNA Ab: 10 IU/mL — ABNORMAL HIGH

## 2022-06-13 LAB — COMPLETE METABOLIC PANEL WITH GFR
AG Ratio: 1.8 (calc) (ref 1.0–2.5)
ALT: 4 U/L — ABNORMAL LOW (ref 6–29)
AST: 17 U/L (ref 10–30)
Albumin: 3.3 g/dL — ABNORMAL LOW (ref 3.6–5.1)
Alkaline phosphatase (APISO): 31 U/L (ref 31–125)
BUN: 12 mg/dL (ref 7–25)
CO2: 28 mmol/L (ref 20–32)
Calcium: 8.9 mg/dL (ref 8.6–10.2)
Chloride: 107 mmol/L (ref 98–110)
Creat: 0.78 mg/dL (ref 0.50–0.96)
Globulin: 1.8 g/dL (calc) — ABNORMAL LOW (ref 1.9–3.7)
Glucose, Bld: 73 mg/dL (ref 65–99)
Potassium: 4.3 mmol/L (ref 3.5–5.3)
Sodium: 142 mmol/L (ref 135–146)
Total Bilirubin: 0.2 mg/dL (ref 0.2–1.2)
Total Protein: 5.1 g/dL — ABNORMAL LOW (ref 6.1–8.1)
eGFR: 111 mL/min/{1.73_m2} (ref >=60)

## 2022-06-13 LAB — C3 AND C4
C3 Complement: 127 mg/dL (ref 83–193)
C4 Complement: 39 mg/dL (ref 15–57)

## 2022-06-13 LAB — SEDIMENTATION RATE: Sed Rate: 9 mm/h (ref 0–20)

## 2022-07-29 ENCOUNTER — Ambulatory Visit: Payer: 59 | Admitting: Rheumatology

## 2022-08-19 ENCOUNTER — Ambulatory Visit: Payer: 59 | Admitting: Rheumatology

## 2022-09-04 NOTE — Progress Notes (Signed)
Office Visit Note  Patient: Alexis Rice             Date of Birth: 08-14-2001           MRN: [id number]             PCP: Bartholome Bill, MD Referring: Bartholome Bill, MD Visit Date: 09/18/2022 Occupation: @GUAROCC @  Subjective:  Medication management  History of Present Illness: Alexis Rice is a 21 y.o. female with history of systemic lupus and DP GN class IV and membranous glomerulopathy class V.  She states that she has been off her prednisone for almost a month now.  She continues to be on Benlysta injections weekly, hydroxychloroquine 200 mg p.o. daily and CellCept 500 mg 1 tablet twice daily.  Her amlodipine prescription was changed to losartan by her nephrologist.  She denies any joint pain or joint swelling.  She denies any history of oral ulcers, nasal ulcers, malar rash, sicca symptoms, Raynaud's phenomenon or lymphadenopathy.  She states she developed some rash after sun exposure which resolved.  She has been followed by Dr. Candiss [facility]se at [facility] and her appointment was a month ago.  She has been walking and also doing stretching exercises.   Activities of Daily Living:  Patient reports morning stiffness for 0 minutes.   Patient Denies nocturnal pain.  Difficulty dressing/grooming: Denies Difficulty climbing stairs: Denies Difficulty getting out of chair: Denies Difficulty using hands for taps, buttons, cutlery, and/or writing: Denies  Review of Systems  Constitutional:  Positive for fatigue.  HENT:  Negative for mouth sores and mouth dryness.   Eyes:  Negative for dryness.  Respiratory:  Negative for shortness of breath.   Cardiovascular:  Negative for chest pain and palpitations.  Gastrointestinal:  Negative for blood in stool, constipation and diarrhea.  Endocrine: Negative for increased urination.  Genitourinary:  Negative for involuntary urination.  Musculoskeletal:  Negative for joint pain, gait problem, joint pain, joint swelling, myalgias,  muscle weakness, morning stiffness, muscle tenderness and myalgias.  Skin:  Positive for rash and sensitivity to sunlight. Negative for color change and hair loss.  Allergic/Immunologic: Negative for susceptible to infections.  Neurological:  Negative for dizziness and headaches.  Hematological:  Negative for swollen glands.  Psychiatric/Behavioral:  Negative for depressed mood and sleep disturbance. The patient is not nervous/anxious.     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   Diarrhea 03/27/2022   Anasarca 03/27/2022   Chest pain/palpitation/anxiety 03/27/2022   Palpitation 03/27/2022   Elevated troponin 03/27/2022   Pleural effusion on left 03/27/2022   Anxiety 03/27/2022   Systemic lupus erythematosus ([place]) 03/21/2022   Chronic (diffuse) proliferative glomerulonephritis Class IV, membranous glomerulopathy classV 03/21/2022   High risk medication use 03/21/2022   Family history of lupus erythematosus 03/21/2022   Hx of migraines 03/21/2022   Essential hypertension 02/25/2022   Poor appetite 09/28/2013   Nausea     Past Medical History:  Diagnosis Date   Collagen vascular disease ([employer])    Hypertension    Kidney disease    stage 2 CKD   Lupus ([facility])    Nausea    Reduced Appetite    Family History  Problem Relation Age of Onset   Ulcers Father    Asthma Sister    Lupus Sister    Cholelithiasis Maternal Aunt    Past Surgical History:  Procedure Laterality Date   RENAL  BIOPSY Left    Social History   Social History Narrative   7th grade 2014-2015    There is no immunization history on file for this patient.   Objective: Vital Signs: BP (!) 144/98 (BP Location: Left Arm, Patient Position: Sitting, Cuff Size: Normal)   Pulse 96   Resp 14   Ht 5\' 5"  (1.651 m)   Wt 131 lb 9.6 oz (59.7 kg)   BMI 21.90 kg/m    Physical Exam Vitals and nursing note reviewed.  Constitutional:       Appearance: She is well-developed.  HENT:     Head: Normocephalic and atraumatic.  Eyes:     Conjunctiva/sclera: Conjunctivae normal.  Cardiovascular:     Rate and Rhythm: Normal rate and regular rhythm.     Heart sounds: Normal heart sounds.  Pulmonary:     Effort: Pulmonary effort is normal.     Breath sounds: Normal breath sounds.  Abdominal:     General: Bowel sounds are normal.     Palpations: Abdomen is soft.  Musculoskeletal:     Cervical back: Normal range of motion.  Lymphadenopathy:     Cervical: No cervical adenopathy.  Skin:    General: Skin is warm and dry.     Capillary Refill: Capillary refill takes less than 2 seconds.     Comments: Stretch marks were noted on her bilateral lower extremities.  Neurological:     Mental Status: She is alert and oriented to person, place, and time.  Psychiatric:        Behavior: Behavior normal.      Musculoskeletal Exam: Cervical, thoracic and lumbar spine were in good range of motion.  Shoulder joints, elbow joints, wrist joints, MCPs PIPs and DIPs with good range of motion with no synovitis.  Hip joints, knee joints, ankles, MTPs and PIPs with good range of motion.  She had thickening of the right knee joint without any warmth swelling or effusion.  CDAI Exam: CDAI Score: -- Patient Global: --; Provider Global: -- Swollen: --; Tender: -- Joint Exam 09/18/2022   No joint exam has been documented for this visit   There is currently no information documented on the homunculus. Go to the Rheumatology activity and complete the homunculus joint exam.  Investigation: No additional findings.  Imaging: No results found.  Recent Labs: Lab Results  Component Value Date   WBC 9.4 06/10/2022   HGB 11.1 (L) 06/10/2022   PLT 313 06/10/2022   NA 142 06/10/2022   K 4.3 06/10/2022   CL 107 06/10/2022   CO2 28 06/10/2022   GLUCOSE 73 06/10/2022   BUN 12 06/10/2022   CREATININE 0.78 06/10/2022   BILITOT 0.2 06/10/2022    ALKPHOS 39 05/02/2022   AST 17 06/10/2022   ALT 4 (L) 06/10/2022   PROT 5.1 (L) 06/10/2022   ALBUMIN 3.3 (L) 05/02/2022   CALCIUM 8.9 06/10/2022    Speciality Comments: No specialty comments available.  Procedures:  No procedures performed Allergies: Patient has no known allergies.   Assessment / Plan:     Visit Diagnoses: SLE:+ANA,+ds,RNP,+Ro,+Sm,lowC3, fatigue, arthralgias, malar rash, DPGN-patient has been off prednisone for a month now.  She continues to be on CellCept, hydroxychloroquine and Benlysta.  She has been off Bactrim now.  She has been followed closely by Dr[facility].  Patient states that her renal functions have been stable.  She denies any history of oral ulcers, nasal ulcers, sicca symptoms, malar rash, Raynaud's phenomenon or  lymphadenopathy.  She had recent photosensitive rash which resolved.  She denies any joint pain and joint swelling.  She is working as a Physicist, medical and doing part-time job.  She also has been walking and stretching.  Use of sunscreen was emphasized.  Chronic (diffuse) proliferative glomerulonephritis Class IV, membranous glomerulopathy classV - Followed by Dr. Anthony Sar.  High risk medication use - MMF 500 mg p.o. BID,HCQ 200 mg po qd, Benlysta 200 mg subcutaneous.  Patient has been off prednisone for a month now.  Labs from June 10, 2022 were reviewed which showed hemoglobin of 11.1.  CMP was normal.  Sed rate was 9, complements were normal and double-stranded ENA was 10.  I will repeat labs today.  We will check labs every 3 months to monitor disease process.  Patient has not had eye examination to monitor for Plaquenil ocular toxicity.  She was advised to get eye examination as soon as possible.  Information regarding immunization was placed in the AVS.  Patient states she recently received her COVID-19 booster.  Effusion, right knee-resolved.  She continues to have some thickening of the right knee joint.  Family  history of lupus erythematosus-Sister  Essential hypertension-blood pressure is a still elevated.  Patient states she was switched from amlodipine to losartan.  She states she had a stressful day otherwise her blood pressure has been running normal.  Hx of migraine  Vitamin D deficiency-I will check vitamin D level today.  Orders: Orders Placed This Encounter  Procedures   Protein / creatinine ratio, urine   CBC with Differential/Platelet   COMPLETE METABOLIC PANEL WITH GFR   Anti-DNA antibody, double-stranded   C3 and C4   Sedimentation rate   VITAMIN D 25 Hydroxy (Vit-D Deficiency, Fractures)   No orders of the defined types were placed in this encounter.    Follow-Up Instructions: Return in about 3 months (around 12/18/2022) for Systemic lupus.   Alexis Savoy, MD  Note - This record has been created using Animal nutritionist.  Chart creation errors have been sought, but may not always  have been located. Such creation errors do not reflect on  the standard of medical care.

## 2022-09-18 ENCOUNTER — Encounter: Payer: Self-pay | Admitting: Rheumatology

## 2022-09-18 ENCOUNTER — Ambulatory Visit: Payer: 59 | Attending: Rheumatology | Admitting: Rheumatology

## 2022-09-18 VITALS — BP 144/98 | HR 96 | Resp 14 | Ht 65.0 in | Wt 131.6 lb

## 2022-09-18 DIAGNOSIS — E559 Vitamin D deficiency, unspecified: Secondary | ICD-10-CM

## 2022-09-18 DIAGNOSIS — N033 Chronic nephritic syndrome with diffuse mesangial proliferative glomerulonephritis: Secondary | ICD-10-CM

## 2022-09-18 DIAGNOSIS — M3214 Glomerular disease in systemic lupus erythematosus: Secondary | ICD-10-CM | POA: Diagnosis not present

## 2022-09-18 DIAGNOSIS — M25461 Effusion, right knee: Secondary | ICD-10-CM

## 2022-09-18 DIAGNOSIS — Z79899 Other long term (current) drug therapy: Secondary | ICD-10-CM | POA: Diagnosis not present

## 2022-09-18 DIAGNOSIS — I10 Essential (primary) hypertension: Secondary | ICD-10-CM

## 2022-09-18 DIAGNOSIS — Z8669 Personal history of other diseases of the nervous system and sense organs: Secondary | ICD-10-CM

## 2022-09-18 DIAGNOSIS — Z840 Family history of diseases of the skin and subcutaneous tissue: Secondary | ICD-10-CM

## 2022-09-18 NOTE — Patient Instructions (Signed)
Standing Labs We placed an order today for your standing lab work.   Please have your standing labs drawn in December and every3 months  Please have your labs drawn 2 weeks prior to your appointment so that the provider can discuss your lab results at your appointment.  Please note that you may see your imaging and lab results in [place] before we have reviewed them. We will contact you once all results are reviewed. Please allow our office up to 72 hours to thoroughly review all of the results before contacting the office for clarification of your results.  Lab hours are: Monday through Thursday from 1:30 pm-4:30 pm and Friday from 1:30 pm- 4:00 pm  You may experience shorter wait times on Monday, Thursday or Friday afternoons,.   Effective October 20, 2022, new lab hours will be: Monday through Thursday from 8:00 am -12:30 pm and 1:00 pm-5:00 pm and Friday from 8:00 am-12:00 pm.  Please be advised, all patients with office appointments requiring lab work will take precedent over walk-in lab work.   Labs are drawn by [facility]. Please bring your co-pay at the time of your lab draw.  You may receive a bill from [facility] for your lab work.  Please note if you are on Hydroxychloroquine and and an order has been placed for a Hydroxychloroquine level, you will need to have it drawn 4 hours or more after your last dose.  If you wish to have your labs drawn at another location, please call the office 24 hours in advance so we can fax the orders.  The office is located at [address], [place], [place], [place] [address] No appointment is necessary.    If you have any questions regarding directions or hours of operation,  please call [phone].   As a reminder, please drink plenty of water prior to coming for your lab work. Thanks!   Vaccines You are taking a medication(s) that can suppress your immune system.  The following immunizations are recommended: Flu annually Covid-19  Td/Tdap  (tetanus, diphtheria, pertussis) every 10 years Pneumonia (Prevnar 15 then Pneumovax 23 at least 1 year apart.  Alternatively, can take Prevnar 20 without needing additional dose) Shingrix: 2 doses from 4 weeks to 6 months apart  Please check with your PCP to make sure you are up to date.

## 2022-09-19 LAB — CBC WITH DIFFERENTIAL/PLATELET
Absolute Monocytes: 320 cells/uL (ref 200–950)
Basophils Absolute: 12 cells/uL (ref 0–200)
Basophils Relative: 0.3 %
Eosinophils Absolute: 109 cells/uL (ref 15–500)
Eosinophils Relative: 2.8 %
HCT: 29.1 % — ABNORMAL LOW (ref 35.0–45.0)
Hemoglobin: 9.4 g/dL — ABNORMAL LOW (ref 11.7–15.5)
Lymphs Abs: 1186 cells/uL (ref 850–3900)
MCH: 27.4 pg (ref 27.0–33.0)
MCHC: 32.3 g/dL (ref 32.0–36.0)
MCV: 84.8 fL (ref 80.0–100.0)
MPV: 9.6 fL (ref 7.5–12.5)
Monocytes Relative: 8.2 %
Neutro Abs: 2274 cells/uL (ref 1500–7800)
Neutrophils Relative %: 58.3 %
Platelets: 308 10*3/uL (ref 140–400)
RBC: 3.43 10*6/uL — ABNORMAL LOW (ref 3.80–5.10)
RDW: 12.7 % (ref 11.0–15.0)
Total Lymphocyte: 30.4 %
WBC: 3.9 10*3/uL (ref 3.8–10.8)

## 2022-09-19 LAB — COMPLETE METABOLIC PANEL WITH GFR
AG Ratio: 1.7 (calc) (ref 1.0–2.5)
ALT: 3 U/L — ABNORMAL LOW (ref 6–29)
AST: 12 U/L (ref 10–30)
Albumin: 3.8 g/dL (ref 3.6–5.1)
Alkaline phosphatase (APISO): 39 U/L (ref 31–125)
BUN: 11 mg/dL (ref 7–25)
CO2: 26 mmol/L (ref 20–32)
Calcium: 9.1 mg/dL (ref 8.6–10.2)
Chloride: 107 mmol/L (ref 98–110)
Creat: 0.56 mg/dL (ref 0.50–0.96)
Globulin: 2.3 g/dL (calc) (ref 1.9–3.7)
Glucose, Bld: 82 mg/dL (ref 65–99)
Potassium: 3.7 mmol/L (ref 3.5–5.3)
Sodium: 143 mmol/L (ref 135–146)
Total Bilirubin: 0.2 mg/dL (ref 0.2–1.2)
Total Protein: 6.1 g/dL (ref 6.1–8.1)
eGFR: 133 mL/min/{1.73_m2} (ref >=60)

## 2022-09-19 LAB — C3 AND C4
C3 Complement: 126 mg/dL (ref 83–193)
C4 Complement: 37 mg/dL (ref 15–57)

## 2022-09-19 LAB — ANTI-DNA ANTIBODY, DOUBLE-STRANDED: ds DNA Ab: 14 IU/mL — ABNORMAL HIGH

## 2022-09-19 LAB — VITAMIN D 25 HYDROXY (VIT D DEFICIENCY, FRACTURES): Vit D, 25-Hydroxy: 16 ng/mL — ABNORMAL LOW (ref 30–100)

## 2022-09-19 LAB — SEDIMENTATION RATE: Sed Rate: 28 mm/h — ABNORMAL HIGH (ref 0–20)

## 2022-09-19 LAB — PROTEIN / CREATININE RATIO, URINE
Creatinine, Urine: 161 mg/dL (ref 20–275)
Protein/Creat Ratio: 2062 mg/g creat — ABNORMAL HIGH (ref 24–184)
Protein/Creatinine Ratio: 2.062 mg/mg creat — ABNORMAL HIGH (ref 0.024–0.184)
Total Protein, Urine: 332 mg/dL — ABNORMAL HIGH (ref 5–24)

## 2022-09-23 NOTE — Progress Notes (Signed)
Complements are normal, vitamin D is low at 16, sed rate elevated at 28.  Double-stranded DNA is at 14 which is higher than before.  CMP is normal.  CBC shows anemia with hemoglobin of 9.4.  Urine protein creatinine ratio elevated at two 2.062.  We will send these results to Dr. Candiss Norse.  I left a message for the patient to call back on her answering machine.  Please call in vitamin D 50,000 units once a week for 3 months.  Patient should take vitamin D 2000 units daily after she finishes the course of prescription vitamin D.

## 2022-09-24 ENCOUNTER — Other Ambulatory Visit: Payer: Self-pay | Admitting: *Deleted

## 2022-09-24 DIAGNOSIS — E559 Vitamin D deficiency, unspecified: Secondary | ICD-10-CM

## 2022-09-24 MED ORDER — VITAMIN D (ERGOCALCIFEROL) 1.25 MG (50000 UNIT) PO CAPS: 50000.0000 [IU] | ORAL_CAPSULE | ORAL | 0 refills | Status: DC

## 2022-09-24 NOTE — Telephone Encounter (Signed)
-----   Message from Bo Merino, MD sent at 09/23/2022  4:31 PM EDT ----- Complements are normal, vitamin D is low at 16, sed rate elevated at 28.  Double-stranded DNA is at 14 which is higher than before.  CMP is normal.  CBC shows anemia with hemoglobin of 9.4.  Urine protein creatinine ratio elevated at two 2.062.  We w ill send these results to Dr. Candiss Norse.  I left a message for the patient to call back on her answering machine.  Please call in vitamin D 50,000 units once a week for 3 months.  Patient should take vitamin D 2000 units daily after she finishes the cour se of prescription vitamin D.

## 2022-09-24 NOTE — Progress Notes (Signed)
I called patient and discussed lab results with her.  I advised her to call Dr. Candiss Norse tomorrow for advice regarding the management.  Patient will call Dr. Keturah Barre office tomorrow.

## 2022-09-30 NOTE — Progress Notes (Signed)
Please call to check if patient saw her nephrologist.

## 2022-10-31 NOTE — Progress Notes (Signed)
I spoke with the patient.  Patient stated that she reached out to her nephrologist.  They will repeat blood work and they decided to hold off prednisone.  She has an appointment on December 19 with her nephrologist.

## 2022-12-11 NOTE — Progress Notes (Signed)
Office Visit Note  Patient: Alexis Rice             Date of Birth: [birth year]           MRN: [id number]             PCP: Bartholome Bill, MD Referring: Bartholome Bill, MD Visit Date: 12/24/2022 Occupation: _0 @  Subjective:  Medication monitoring   History of Present Illness: Alexis Rice is a 21 y.o. female with history of systemic lupus.  Patient continues to take MMF 500 mg p.o. BID, HCQ 200 mg po qd, and Benlysta 200 mg subcutaneous once weekly.  She is tolerating combination therapy without any side effects and has not missed any doses recently.  Patient reports that she had a recent office visit with her nephrologist in December 2023 at which time she had updated lab work.  According to the patient she was told that her proteinuria had improved and no medications will be added at this time.  She has been off of prednisone since the end of summer 2023 and has not had to take any prednisone tapers recently.  According to the patient there was discussion of a repeat renal biopsy in the future for monitoring purposes but for now she will be following up with her nephrologist every 4 months. She denies any signs or symptoms of a lupus flare.  She has not had any joint pain, joint swelling, or joint stiffness.  Her energy level has been stable and she has been sleeping well at night.  She denies any recent rashes or signs of alopecia.  She has not had any sores in her mouth or nose.  She denies any sicca symptoms.  She denies any swollen lymph nodes.  She denies any edema currently.  She has not had any symptoms of Raynaud's phenomenon.  She denies any shortness of breath, pleuritic chest pain, or palpitations. She completed the prescription for vitamin D 50,000 units once weekly and plans on starting a maintenance over-the-counter dose of vitamin D as recommended.    Activities of Daily Living:  Patient reports morning stiffness for 0 minutes.   Patient Denies nocturnal pain.   Difficulty dressing/grooming: Denies Difficulty climbing stairs: Denies Difficulty getting out of chair: Denies Difficulty using hands for taps, buttons, cutlery, and/or writing: Reports  Review of Systems  Constitutional:  Positive for fatigue.  HENT:  Negative for mouth sores and mouth dryness.   Eyes:  Negative for dryness.  Respiratory:  Negative for shortness of breath.   Cardiovascular:  Negative for chest pain and palpitations.  Gastrointestinal:  Negative for blood in stool, constipation and diarrhea.  Endocrine: Negative for increased urination.  Genitourinary:  Negative for involuntary urination.  Musculoskeletal:  Negative for joint pain, gait problem, joint pain, joint swelling, myalgias, muscle weakness, morning stiffness, muscle tenderness and myalgias.  Skin:  Positive for sensitivity to sunlight. Negative for color change, rash and hair loss.  Allergic/Immunologic: Negative for susceptible to infections.  Neurological:  Negative for dizziness and headaches.  Hematological:  Negative for swollen glands.  Psychiatric/Behavioral:  Negative for depressed mood and sleep disturbance. The patient is not nervous/anxious.     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   Diarrhea 03/27/2022   Anasarca 03/27/2022   Chest pain/palpitation/anxiety 03/27/2022   Palpitation 03/27/2022   Elevated troponin 03/27/2022   Pleural effusion on left 03/27/2022   Anxiety 03/27/2022  Systemic lupus erythematosus ([place]) 03/21/2022   Chronic (diffuse) proliferative glomerulonephritis Class IV, membranous glomerulopathy classV 03/21/2022   High risk medication use 03/21/2022   Family history of lupus erythematosus 03/21/2022   Hx of migraines 03/21/2022   Essential hypertension 02/25/2022   Poor appetite 09/28/2013   Nausea     Past Medical History:  Diagnosis Date   Collagen vascular disease ([place])     Hypertension    Kidney disease    stage 2 CKD   Lupus ([facility])    Nausea    Reduced Appetite    Family History  Problem Relation Age of Onset   Ulcers Father    Asthma Sister    Lupus Sister    Cholelithiasis Maternal Aunt    Past Surgical History:  Procedure Laterality Date   RENAL BIOPSY Left    Social History   Social History Narrative   7th grade 2014-2015   Immunization History  Administered Date(s) Administered   Pension scheme manager 43yr & up 01/11/2022     Objective: Vital Signs: BP 130/85 (BP Location: Left Arm, Patient Position: Sitting, Cuff Size: Normal)   Pulse 99   Resp 13   Ht _0  (1.651 m)   Wt 126 lb (57.2 kg)   BMI 20.97 kg/m    Physical Exam Vitals and nursing note reviewed.  Constitutional:      Appearance: She is well-developed.  HENT:     Head: Normocephalic and atraumatic.  Eyes:     Conjunctiva/sclera: Conjunctivae normal.  Cardiovascular:     Rate and Rhythm: Normal rate and regular rhythm.     Heart sounds: Normal heart sounds.  Pulmonary:     Effort: Pulmonary effort is normal.     Breath sounds: Normal breath sounds.  Abdominal:     General: Bowel sounds are normal.     Palpations: Abdomen is soft.  Musculoskeletal:     Cervical back: Normal range of motion.  Skin:    General: Skin is warm and dry.     Capillary Refill: Capillary refill takes less than 2 seconds.  Neurological:     Mental Status: She is alert and oriented to person, place, and time.  Psychiatric:        Behavior: Behavior normal.      Musculoskeletal Exam: C-spine, thoracic spine, lumbar spine have good range of motion.  Shoulder joints, elbow joints, wrist joints, MCPs, PIPs, DIPs have good range of motion with no synovitis.  Hip joints, knee joints, ankle joints, MCPs, PIPs, DIPs have good range of motion with no synovitis.  CDAI Exam: CDAI Score: -- Patient Global: --; Provider Global: -- Swollen: --; Tender: -- Joint Exam  12/24/2022   No joint exam has been documented for this visit   There is currently no information documented on the homunculus. Go to the Rheumatology activity and complete the homunculus joint exam.  Investigation: No additional findings.  Imaging: No results found.  Recent Labs: Lab Results  Component Value Date   WBC 3.9 09/18/2022   HGB 9.4 (L) 09/18/2022   PLT 308 09/18/2022   NA 143 09/18/2022   K 3.7 09/18/2022   CL 107 09/18/2022   CO2 26 09/18/2022   GLUCOSE 82 09/18/2022   BUN 11 09/18/2022   CREATININE 0.56 09/18/2022   BILITOT 0.2 09/18/2022   ALKPHOS 39 05/02/2022   AST 12 09/18/2022   ALT 3 (L) 09/18/2022   PROT 6.1 09/18/2022   ALBUMIN 3.3 (L) 05/02/2022  CALCIUM 9.1 09/18/2022    Speciality Comments: PLQ Eye Exam 04/30/2022 normal [facility] f/u 12 months  Procedures:  No procedures performed Allergies: Patient has no known allergies.    Assessment / Plan:     Visit Diagnoses: SLE:+ANA,+ds,RNP,+Ro,+Sm,lowC3, fatigue, arthralgias, malar rash, DPGN: She has not been experiencing any symptoms of a systemic lupus flare. She has clinically been doing well taking CellCept 500 twice daily, Benlysta 200 mg subcutaneous injections once weekly, and Plaquenil 200 mg 1 tablet by mouth daily.  She has been tolerating triple therapy without any side effects.  No recent or recurrent infections.  She discontinued prednisone at the end of August 2023 and has not had any recurrence of symptoms thus far.  She has not experienced any joint pain and has no synovitis on examination today.  She has not had any oral or nasal ulcerations.  No sicca symptoms.  No cervical lymphadenopathy.  She has not had any symptoms of Raynaud's phenomenon. No malar rash currently.  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  She is also planning to get her masters degree after completing her undergraduate degree.  Overall her stress levels have  been manageable.  Her energy level has been stable.  She has been sleeping well at night. She has been following up closely with Dr. Candiss Norse at [facility]. Lab work from 09/18/2022 was reviewed today in the office: Complements within normal limits, double-stranded DNA 14, ESR 28, vitamin D 16, creatinine 0.56 and GFR 133, WBC count 3,9, Hgb 9.4, Plt 308, and protein creatinine ratio elevated 2.062.  Lab work from 12/03/2022 was reviewed today in the office UA: 2+ protein creatinine 0.67, GFR 127, LFTs within normal limits, phosphorus 4.7, white blood cell count 4.1, hemoglobin 10.6, hematocrit 32.3, platelets 301, complements within normal limits, double-stranded DNA 15, protein creatinine ratio elevated-915. The patient's next appointment at [facility] is scheduled in April 2024.  No medication changes were recommended at her recent office visit with Dr. Candiss Norse.   She will remain on triple therapy as prescribed.  She was advised to notify us if she develops signs or symptoms of a flare.  She will follow-up in the office in 3 months or sooner if needed.  Chronic (diffuse) proliferative glomerulonephritis Class IV, membranous glomerulopathy classV - Followed by Dr. Gean Quint. Tacrolimus was not added after her last office visit with Dr. Candiss Norse.  She will continue to follow-up closely at [facility].  She will remain on the current treatment regimen for now.  High risk medication use -CellCept 250 mg 2 tablets by mouth twice daily, Plaquenil 200 mg 1 tablet by mouth daily, and Benlysta 200 mg subcutaneous injections once weekly.   Tolerating triple therapy without any side effects or injection site reactions from [facility]. Patient had updated lab work on 12/03/2022 ordered by Dr. Candiss Norse: Creatinine was 0.67 and GFR was 127, AST 14, ALT 6, white blood cell count 4.1, hemoglobin 10.6, hematocrit 32.3, platelets 301.   PLQ Eye Exam 04/30/2022 normal [facility] f/u 12  months.  She has not had any recent or recurrent infections.  Effusion, right knee - Resolved.  No recurrence of symptoms since discontinuing prednisone.  She has good range of motion of the right knee joint on examination today.  No warmth or effusion noted.  Family history of lupus erythematosus-Sister  Essential hypertension: BP was 130/85 today in the office.  Vitamin D deficiency: Vitamin  D 16 on 09/18/22.  Patient completed the course of vitamin D 50,000 units once weekly x 3 months.  She is planning on continuing maintenance dose of vitamin D over-the-counter.  Vitamin D level can be rechecked with her next lab work.  Hx of migraines  Orders: No orders of the defined types were placed in this encounter.  No orders of the defined types were placed in this encounter.    Follow-Up Instructions: Return in 3 months (on 03/25/2023) for Systemic lupus erythematosus.   Ofilia Neas, PA-C  Note - This record has been created using Dragon software.  Chart creation errors have been sought, but may not always  have been located. Such creation errors do not reflect on  the standard of medical care.

## 2022-12-24 ENCOUNTER — Encounter: Payer: Self-pay | Admitting: Physician Assistant

## 2022-12-24 ENCOUNTER — Ambulatory Visit: Payer: 59 | Attending: Physician Assistant | Admitting: Physician Assistant

## 2022-12-24 VITALS — BP 130/85 | HR 99 | Resp 13 | Ht 65.0 in | Wt 126.0 lb

## 2022-12-24 DIAGNOSIS — M3214 Glomerular disease in systemic lupus erythematosus: Secondary | ICD-10-CM

## 2022-12-24 DIAGNOSIS — N033 Chronic nephritic syndrome with diffuse mesangial proliferative glomerulonephritis: Secondary | ICD-10-CM | POA: Diagnosis not present

## 2022-12-24 DIAGNOSIS — I10 Essential (primary) hypertension: Secondary | ICD-10-CM

## 2022-12-24 DIAGNOSIS — Z79899 Other long term (current) drug therapy: Secondary | ICD-10-CM | POA: Diagnosis not present

## 2022-12-24 DIAGNOSIS — M25461 Effusion, right knee: Secondary | ICD-10-CM | POA: Diagnosis not present

## 2022-12-24 DIAGNOSIS — Z8669 Personal history of other diseases of the nervous system and sense organs: Secondary | ICD-10-CM

## 2022-12-24 DIAGNOSIS — E559 Vitamin D deficiency, unspecified: Secondary | ICD-10-CM

## 2022-12-24 DIAGNOSIS — Z840 Family history of diseases of the skin and subcutaneous tissue: Secondary | ICD-10-CM

## 2023-03-11 NOTE — Progress Notes (Unsigned)
Office Visit Note  Patient: Alexis Rice             Date of Birth: Apr 10, 2001           MRN: [id number]             PCP: Bartholome Bill, MD Referring: Bartholome Bill, MD Visit Date: 03/25/2023 Occupation: @GUAROCC @  Subjective:  Medication monitoring  History of Present Illness: Alexis Rice is a 22 y.o. female with history of systemic lupus erythematosus.  Patient is currently taking CellCept 250 mg 2 tablets by mouth twice daily, Plaquenil 200 mg 1 tablet by mouth daily, and Benlysta 200 mg subcutaneous injections once weekly.  She has been tolerating combination therapy without any side effects or injection site reactions from [facility].  She has not missed any doses recently.  She denies any signs or symptoms of a systemic lupus flare.  She is not experiencing any joint pain, joint swelling, or joint stiffness.  Her energy level has been stable.  She will be graduating this month and will be starting her masters program in the fall.  She has been sleeping well at night overall.  She denies any shortness of breath, palpitations, or pleuritic chest pain.  She denies any swollen lymph nodes.  She has not had any recent rashes, photosensitivity, or hair loss.  She denies any symptoms of Raynaud's phenomenon.  She has not had any sores in her mouth or nose.  Patient reports that she has been experiencing seasonal allergies and has been taking Claritin with no relief.  She is considering switching to Zyrtec or Allegra.  She denies any recurrent infections.  She denies any new medical conditions.  She has no upcoming appointment scheduled at [facility] on 04/20/2023.  She will be having updated lab work 1 week prior to her appointment. She has started to take Vitamin D 2000 units daily.     Activities of Daily Living:  Patient reports morning stiffness for 0 minutes.   Patient Denies nocturnal pain.  Difficulty dressing/grooming: Denies Difficulty climbing stairs: Denies Difficulty  getting out of chair: Denies Difficulty using hands for taps, buttons, cutlery, and/or writing: Reports  Review of Systems  Constitutional:  Negative for fatigue.  HENT:  Negative for mouth sores and mouth dryness.   Eyes:  Positive for dryness.  Respiratory:  Negative for shortness of breath.   Cardiovascular:  Negative for chest pain and palpitations.  Gastrointestinal:  Negative for blood in stool, constipation and diarrhea.  Endocrine: Negative for increased urination.  Genitourinary:  Negative for involuntary urination.  Musculoskeletal:  Negative for joint pain, gait problem, joint pain, joint swelling, myalgias, muscle weakness, morning stiffness, muscle tenderness and myalgias.  Skin:  Positive for sensitivity to sunlight. Negative for color change, rash and hair loss.  Allergic/Immunologic: Negative for susceptible to infections.  Neurological:  Negative for dizziness and headaches.  Hematological:  Negative for swollen glands.  Psychiatric/Behavioral:  Negative for depressed mood and sleep disturbance. The patient is not nervous/anxious.     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   Diarrhea 03/27/2022   Anasarca 03/27/2022   Chest pain/palpitation/anxiety 03/27/2022   Palpitation 03/27/2022   Elevated troponin 03/27/2022   Pleural effusion on left 03/27/2022   Anxiety 03/27/2022   Systemic lupus erythematosus 03/21/2022   Chronic (diffuse) proliferative glomerulonephritis Class IV, membranous glomerulopathy classV 03/21/2022   High risk medication use 03/21/2022  Family history of lupus erythematosus 03/21/2022   Hx of migraines 03/21/2022   Essential hypertension 02/25/2022   Poor appetite 09/28/2013   Nausea     Past Medical History:  Diagnosis Date   Collagen vascular disease    Hypertension    Kidney disease    stage 2 CKD   Lupus    Nausea    Reduced Appetite    Family  History  Problem Relation Age of Onset   Ulcers Father    Asthma Sister    Lupus Sister    Cholelithiasis Maternal Aunt    Past Surgical History:  Procedure Laterality Date   RENAL BIOPSY Left    Social History   Social History Narrative   7th grade 2014-2015   Immunization History  Administered Date(s) Administered   Pension scheme manager 55yrs & up 01/11/2022     Objective: Vital Signs: BP 129/88 (BP Location: Left Arm, Patient Position: Sitting, Cuff Size: Small)   Pulse (!) 112   Resp 12   Ht 5\' 5"  (1.651 m)   Wt 129 lb (58.5 kg)   BMI 21.47 kg/m    Physical Exam Vitals and nursing note reviewed.  Constitutional:      Appearance: She is well-developed.  HENT:     Head: Normocephalic and atraumatic.  Eyes:     Conjunctiva/sclera: Conjunctivae normal.  Cardiovascular:     Rate and Rhythm: Normal rate and regular rhythm.     Heart sounds: Normal heart sounds.  Pulmonary:     Effort: Pulmonary effort is normal.     Breath sounds: Normal breath sounds.  Abdominal:     General: Bowel sounds are normal.     Palpations: Abdomen is soft.  Musculoskeletal:     Cervical back: Normal range of motion.  Lymphadenopathy:     Cervical: No cervical adenopathy.  Skin:    General: Skin is warm and dry.     Capillary Refill: Capillary refill takes less than 2 seconds.  Neurological:     Mental Status: She is alert and oriented to person, place, and time.  Psychiatric:        Behavior: Behavior normal.      Musculoskeletal Exam: C-spine, thoracic spine, lumbar spine have good range of motion.  No midline spinal tenderness.  Shoulder joints, elbow joints, wrist joints, MCPs, PIPs, DIPs have good range of motion with no synovitis.  Complete fist formation bilaterally.  Hip joints have good range of motion with no groin pain.  No tenderness over the trochanteric bursa bilaterally.  Knee joints have good range of motion with no warmth or effusion.  Ankle  joints have good range of motion with no tenderness or joint swelling. Striae noted on bilateral LE.   CDAI Exam: CDAI Score: -- Patient Global: --; Provider Global: -- Swollen: --; Tender: -- Joint Exam 03/25/2023   No joint exam has been documented for this visit   There is currently no information documented on the homunculus. Go to the Rheumatology activity and complete the homunculus joint exam.  Investigation: No additional findings.  Imaging: No results found.  Recent Labs: Lab Results  Component Value Date   WBC 3.9 09/18/2022   HGB 9.4 (L) 09/18/2022   PLT 308 09/18/2022   NA 143 09/18/2022   K 3.7 09/18/2022   CL 107 09/18/2022   CO2 26 09/18/2022   GLUCOSE 82 09/18/2022   BUN 11 09/18/2022   CREATININE 0.56 09/18/2022   BILITOT 0.2 09/18/2022  ALKPHOS 39 05/02/2022   AST 12 09/18/2022   ALT 3 (L) 09/18/2022   PROT 6.1 09/18/2022   ALBUMIN 3.3 (L) 05/02/2022   CALCIUM 9.1 09/18/2022    Speciality Comments: PLQ Eye Exam 04/30/2022 normal [facility] f/u 12 months  Procedures:  No procedures performed Allergies: Patient has no known allergies.     Assessment / Plan:     Visit Diagnoses: SLE:+ANA,+ds,RNP,+Ro,+Sm,lowC3, fatigue, arthralgias, malar rash, DPGN - She has not had any signs or symptoms of a systemic lupus flare.  She has clinically been doing well taking CellCept 250 mg 2 tablets by mouth twice daily, Plaquenil 200 mg 1 tablet by mouth daily, and Benlysta 200 mg subcutaneous injections once weekly.  She has been tolerating triple therapy without any side effects or injection site reactions from [facility]. She has not been experiencing any joint pain, joint stiffness, or joint swelling.  No synovitis was noted on examination today.  She has not had any recent rashes.  No Maller rash was noted.  No signs of alopecia noted.  She has not had any symptoms of Raynaud's phenomenon.  No digital ulcerations or signs of gangrene were noted.   She has not had any oral or nasal ulcerations.  No sicca symptoms.  Her energy level has been stable. She has an upcoming appointment at [facility] later this month and will be having updated lab work prior to her appointment. Lab work from 12/03/2022 was reviewed today in the office: Creatinine 0.67, GFR 127, AST 14, ALT 6, white blood cell count 4.1, red blood cell count 3.93, hemoglobin 10.6, platelet count 301k, C3 126, C4 35, double-stranded DNA 15, protein creatinine ratio 915.  Patient will be having updated lab work this month prior to being evaluated by her nephrologist at [facility] Future orders were placed today as well as a written order to have the following lab work obtained.  Advised patient to have results forwarded to our office to review. She will remain on Plaquenil 200 mg 1 tablet by mouth daily, CellCept 250 mg 2 tablets by mouth twice daily, and Benlysta 200 mg subcutaneous injections once weekly.  She was advised to notify us if she develops signs or symptoms of a flare.  Discussed the importance of regular exercise, good sleep hygiene, and stress management.  She will follow-up in the office in 3 months or sooner if needed. Plan: CBC with Differential/Platelet, Protein / creatinine ratio, urine, Anti-DNA antibody, double-stranded, C3 and C4, Sedimentation rate, CMP14+EGFR  Chronic (diffuse) proliferative glomerulonephritis Class IV, membranous glomerulopathy classV - Followed by Dr. Gean Quint. Tacrolimus was not added after her last office visit with Dr. Candiss Norse.  She remains on CellCept, Plaquenil, and Benlysta as prescribed.  She has an upcoming appointment scheduled later this month.  She will be having updated lab work prior to her appointment.  Future orders were placed today for further evaluation.  No medication changes will be made at this time.- Plan: Protein / creatinine ratio, urine  High risk medication use - CellCept 250 mg 2 tablets by mouth twice daily,  Plaquenil 200 mg 1 tablet by mouth daily, and Benlysta 200 mg subcutaneous injections once weekly.  CBC and CMP were drawn on 12/03/2022.  Orders for CBC and CMP were placed today which will be drawn with her upcoming lab work at [facility]. PLQ Eye Exam 04/30/2022 normal [facility] f/u 12 months.  Advised patient to call to schedule an updated  Plaquenil eye examination.  She was given an eye examination form to take with her to her upcoming appointment. No recent or recurrent infections.   - Plan: CBC with Differential/Platelet, CMP14+EGFR  Pain of both elbows: She has good range of motion of both elbow joints with no tenderness or inflammation along the joint line.  Effusion, right knee: Resolved.  No warmth or effusion noted.  Advised patient to notify us if she has a recurrence of symptoms.  Chronic pain of both knees: She is not currently experiencing any discomfort or stiffness in her knee joints.  She is good range of motion of both knee joints with no warmth or effusion on examination today.  Vitamin D deficiency: She has been taking vitamin D 2000 units daily.  Other medical conditions are listed as follows:   Family history of lupus erythematosus-Sister  Essential hypertension: Blood pressure was 129/88 today in the office.  Hx of migraines  Orders: Orders Placed This Encounter  Procedures   CBC with Differential/Platelet   Protein / creatinine ratio, urine   Anti-DNA antibody, double-stranded   C3 and C4   Sedimentation rate   CMP14+EGFR   No orders of the defined types were placed in this encounter.     Follow-Up Instructions: Return in about 3 months (around 06/24/2023) for Systemic lupus erythematosus.   Ofilia Neas, PA-C  Note - This record has been created using Dragon software.  Chart creation errors have been sought, but may not always  have been located. Such creation errors do not reflect on  the standard of medical care.

## 2023-03-25 ENCOUNTER — Encounter: Payer: Self-pay | Admitting: Physician Assistant

## 2023-03-25 ENCOUNTER — Ambulatory Visit: Payer: 59 | Attending: Physician Assistant | Admitting: Physician Assistant

## 2023-03-25 VITALS — BP 129/88 | HR 112 | Resp 12 | Ht 65.0 in | Wt 129.0 lb

## 2023-03-25 DIAGNOSIS — I10 Essential (primary) hypertension: Secondary | ICD-10-CM

## 2023-03-25 DIAGNOSIS — Z8669 Personal history of other diseases of the nervous system and sense organs: Secondary | ICD-10-CM

## 2023-03-25 DIAGNOSIS — M25561 Pain in right knee: Secondary | ICD-10-CM

## 2023-03-25 DIAGNOSIS — M3214 Glomerular disease in systemic lupus erythematosus: Secondary | ICD-10-CM | POA: Diagnosis not present

## 2023-03-25 DIAGNOSIS — M25521 Pain in right elbow: Secondary | ICD-10-CM

## 2023-03-25 DIAGNOSIS — N033 Chronic nephritic syndrome with diffuse mesangial proliferative glomerulonephritis: Secondary | ICD-10-CM | POA: Diagnosis not present

## 2023-03-25 DIAGNOSIS — G8929 Other chronic pain: Secondary | ICD-10-CM

## 2023-03-25 DIAGNOSIS — M25522 Pain in left elbow: Secondary | ICD-10-CM

## 2023-03-25 DIAGNOSIS — E559 Vitamin D deficiency, unspecified: Secondary | ICD-10-CM

## 2023-03-25 DIAGNOSIS — Z79899 Other long term (current) drug therapy: Secondary | ICD-10-CM | POA: Diagnosis not present

## 2023-03-25 DIAGNOSIS — M25562 Pain in left knee: Secondary | ICD-10-CM

## 2023-03-25 DIAGNOSIS — M25461 Effusion, right knee: Secondary | ICD-10-CM

## 2023-03-25 DIAGNOSIS — Z840 Family history of diseases of the skin and subcutaneous tissue: Secondary | ICD-10-CM

## 2023-04-21 LAB — LAB REPORT - SCANNED
Albumin, Urine POC: 404.7
Creatinine, POC: 143.3 mg/dL
EGFR: 130
Microalb Creat Ratio: 282

## 2023-04-28 ENCOUNTER — Telehealth: Payer: Self-pay | Admitting: *Deleted

## 2023-04-28 NOTE — Telephone Encounter (Signed)
Labs received from:[facility]  Drawn on: 04/21/2023  Reviewed by: Dr. Pollyann Savoy   Labs drawn: CMP, Phosphorus, CBC, UA, sed rate, ANA, C3 and C4, Ds DNA, PR/CR Ratio, Alb/Creat Ratio  Results: Hgb 10.5   Hct 32.6    Protein 2+   Occult Blood Trace    RBC 3-10    ANA Positive 1:320 Speckled   Ds DNA- 10   PR/CR Ratio 472   Alb/Creat. Ratio 282

## 2023-06-18 NOTE — Progress Notes (Unsigned)
Office Visit Note  Patient: Alexis Rice             Date of Birth: Nov 09, 2001           MRN: [id number]             PCP: Verlon Au, MD Referring: Verlon Au, MD Visit Date: 07/01/2023 Occupation: @GUAROCC @  Subjective:  Medication monitoring   History of Present Illness: Alexis Rice is a 22 y.o. female with history of systemic lupus erythematosus.  Patient is taking CellCept 250 mg 2 tablets by mouth twice daily, Plaquenil 200 mg 1 tablet by mouth daily, and Benlysta 200 mg subcutaneous injections once weekly.  She is tolerating combination therapy without any side effects.  She has not missed any doses recently.  She denies any recent or recurrent infections.  She denies any signs or symptoms of a systemic lupus flare.  She denies any joint pain or joint swelling at this time.  She has not had any morning stiffness or nocturnal pain.  She denies any sores in her mouth or nose.  She has not had any sicca symptoms.  Her energy level has been stable.  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  She denies any shortness of breath, pleuritic chest pain, or palpitations.  She denies any hair loss or recent rashes.  She has not had any symptoms of Raynaud's phenomenon.  She denies any swollen lymph nodes.  She has not noticed any edema.     Activities of Daily Living:  Patient reports morning stiffness for 0 minutes.   Patient Denies nocturnal pain.  Difficulty dressing/grooming: Denies Difficulty climbing stairs: Denies Difficulty getting out of chair: Denies Difficulty using hands for taps, buttons, cutlery, and/or writing: Denies  Review of Systems  Constitutional:  Positive for fatigue.  HENT:  Negative for mouth sores and mouth dryness.   Eyes:  Negative for dryness.  Respiratory:  Negative for shortness of breath.   Cardiovascular:  Negative for chest pain and palpitations.  Gastrointestinal:   Negative for blood in stool, constipation and diarrhea.  Endocrine: Negative for increased urination.  Genitourinary:  Negative for involuntary urination.  Musculoskeletal:  Negative for joint pain, gait problem, joint pain, joint swelling, myalgias, muscle weakness, morning stiffness, muscle tenderness and myalgias.  Skin:  Negative for color change, rash, hair loss and sensitivity to sunlight.  Allergic/Immunologic: Negative for susceptible to infections.  Neurological:  Negative for dizziness and headaches.  Hematological:  Negative for swollen glands.  Psychiatric/Behavioral:  Negative for depressed mood and sleep disturbance. The patient is not nervous/anxious.     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   Diarrhea 03/27/2022   Anasarca 03/27/2022   Chest pain/palpitation/anxiety 03/27/2022   Palpitation 03/27/2022   Elevated troponin 03/27/2022   Pleural effusion on left 03/27/2022   Anxiety 03/27/2022   Systemic lupus erythematosus (HCC) 03/21/2022   Chronic (diffuse) proliferative glomerulonephritis Class IV, membranous glomerulopathy classV 03/21/2022   High risk medication use 03/21/2022   Family history of lupus erythematosus 03/21/2022   Hx of migraines 03/21/2022   Essential hypertension 02/25/2022   Poor appetite 09/28/2013   Nausea     Past Medical History:  Diagnosis Date   Collagen vascular disease (HCC)    Hypertension    Kidney disease    stage 2 CKD  Lupus (HCC)    Nausea    Reduced Appetite    Family History  Problem Relation Age of Onset   Ulcers Father    Asthma Sister    Lupus Sister    Cholelithiasis Maternal Aunt    Past Surgical History:  Procedure Laterality Date   RENAL BIOPSY Left    Social History   Social History Narrative   7th grade 2014-2015   Immunization History  Administered Date(s) Administered   Research officer, trade union  54yrs & up 01/11/2022     Objective: Vital Signs: BP 120/87 (BP Location: Left Arm, Patient Position: Sitting, Cuff Size: Normal)   Pulse 96   Resp 14   Ht 5\' 5"  (1.651 m)   Wt 122 lb (55.3 kg)   BMI 20.30 kg/m    Physical Exam Vitals and nursing note reviewed.  Constitutional:      Appearance: She is well-developed.  HENT:     Head: Normocephalic and atraumatic.     Mouth/Throat:     Comments: No parotid swelling or tenderness noted Eyes:     Conjunctiva/sclera: Conjunctivae normal.  Cardiovascular:     Rate and Rhythm: Normal rate and regular rhythm.     Heart sounds: Normal heart sounds.  Pulmonary:     Effort: Pulmonary effort is normal.     Breath sounds: Normal breath sounds.  Abdominal:     General: Bowel sounds are normal.     Palpations: Abdomen is soft.  Musculoskeletal:     Cervical back: Normal range of motion.  Lymphadenopathy:     Cervical: No cervical adenopathy.  Skin:    General: Skin is warm and dry.     Capillary Refill: Capillary refill takes less than 2 seconds.     Comments: No malar rash  Neurological:     Mental Status: She is alert and oriented to person, place, and time.  Psychiatric:        Behavior: Behavior normal.      Musculoskeletal Exam: C-spine, thoracic spine, lumbar spine have good range of motion with no discomfort.  Shoulder joints, elbow joints, wrist joints, MCPs, PIPs, DIPs have good range of motion with no synovitis.  Complete fist formation bilaterally.  Hip joints have good range of motion with no groin pain.  Knee joints have good range of motion with no tenderness or joint swelling.  Ankle joints have good ROM with no tenderness or swelling.   CDAI Exam: CDAI Score: -- Patient Global: --; Provider Global: -- Swollen: --; Tender: -- Joint Exam 07/01/2023   No joint exam has been documented for this visit   There is currently no information documented on the homunculus. Go to the Rheumatology activity and complete the  homunculus joint exam.  Investigation: No additional findings.  Imaging: No results found.  Recent Labs: Lab Results  Component Value Date   WBC 3.9 09/18/2022   HGB 9.4 (L) 09/18/2022   PLT 308 09/18/2022   NA 143 09/18/2022   K 3.7 09/18/2022   CL 107 09/18/2022   CO2 26 09/18/2022   GLUCOSE 82 09/18/2022   BUN 11 09/18/2022   CREATININE 0.56 09/18/2022   BILITOT 0.2 09/18/2022   ALKPHOS 39 05/02/2022   AST 12 09/18/2022   ALT 3 (L) 09/18/2022   PROT 6.1 09/18/2022   ALBUMIN 3.3 (L) 05/02/2022   CALCIUM 9.1 09/18/2022    Speciality Comments: PLQ Eye Exam 06/16/2023 normal [facility]. f/u 12 months  Procedures:  No  procedures performed Allergies: Patient has no known allergies.    Assessment / Plan:     Visit Diagnoses: SLE:+ANA,+ds,RNP,+Ro,+Sm,lowC3, fatigue, arthralgias, malar rash, DPGN -She has not had any signs or symptoms of a systemic lupus flare.  She has clinically been doing well on CellCept 250 mg 2 tablets by mouth twice daily, Plaquenil 200 mg 1 tablet by mouth daily, and Benlysta 200 mg subcutaneous injections once weekly.  She is tolerating combination therapy without any side effects and has not had any interruptions in therapy recently.  She has no synovitis on examination today.  Her energy level has been stable.  No Malar rash noted.  She has been trying to avoid direct sun exposure.  Discussed the importance of wearing sunscreen SPF 50 on a daily basis and avoiding direct sun exposure.  She has not had any oral or nasal ulcerations.  No sicca symptoms.  No cervical lymphadenopathy.  No signs of alopecia.  She has not had any shortness of breath, pleuritic chest pain, or palpitations.  Her lungs were clear to auscultation today. Lab work from 04/21/23: ESR 7, Complements WNL, ANA 1:320 speckled, and dsDNA 10. The following lab work will be obtained today for further evaluation.  She will remain on the current treatment regimen.  She was advised to notify us  if she develops any signs or symptoms of a flare.  She will follow-up in the office in 3 months or sooner if needed.  Plan: CBC with Differential/Platelet, Protein / creatinine ratio, urine, COMPLETE METABOLIC PANEL WITH GFR, Anti-DNA antibody, double-stranded, Sedimentation rate, C3 and C4, VITAMIN D 25 Hydroxy (Vit-D Deficiency, Fractures)  Chronic (diffuse) proliferative glomerulonephritis Class IV, membranous glomerulopathy classV - Followed by Dr. Anthony Sar.  She remains on CellCept, Plaquenil, and Benlysta as prescribed. The following lab work will be updated today.  - Plan: Protein / creatinine ratio, urine  High risk medication use - CellCept 250 mg 2 tablets by mouth twice daily, Plaquenil 200 mg 1 tablet by mouth daily, and Benlysta 200 mg subcutaneous injections once weekly.  CBC and CMP updated on 04/21/23.  Orders for CBC and CMP released today.  She will continue to require updated lab work every 3 months.  PLQ Eye Exam 06/16/2023 normal [facility]. f/u 12 months  No recent or recurrent infections.  - Plan: CBC with Differential/Platelet, COMPLETE METABOLIC PANEL WITH GFR  Pain of both elbows: No tenderness or inflammation along the elbow joint line noted.  Effusion, right knee: Resolved.  No warmth or effusion.   Chronic pain of both knees: She has good range of motion of both knee joints on examination today.  No warmth or effusion noted.  She has not been experiencing nocturnal pain or difficulty with ADLs.  Family history of lupus erythematosus-Sister  Essential hypertension: BP was 120/87 today in the office.   Vitamin D deficiency -Vitamin D will be rechecked today.  Plan: VITAMIN D 25 Hydroxy (Vit-D Deficiency, Fractures)  Hx of migraines  Orders: Orders Placed This Encounter  Procedures   CBC with Differential/Platelet   Protein / creatinine ratio, urine   COMPLETE METABOLIC PANEL WITH GFR   Anti-DNA antibody, double-stranded   Sedimentation rate   C3 and C4    VITAMIN D 25 Hydroxy (Vit-D Deficiency, Fractures)   No orders of the defined types were placed in this encounter.  Follow-Up Instructions: Return in about 3 months (around 10/01/2023) for Systemic lupus erythematosus.   Gearldine Bienenstock, PA-C  Note - This  record has been created using [employer].  Chart creation errors have been sought, but may not always  have been located. Such creation errors do not reflect on  the standard of medical care.

## 2023-07-01 ENCOUNTER — Encounter: Payer: Self-pay | Admitting: Physician Assistant

## 2023-07-01 ENCOUNTER — Ambulatory Visit: Payer: 59 | Attending: Physician Assistant | Admitting: Physician Assistant

## 2023-07-01 VITALS — BP 120/87 | HR 96 | Resp 14 | Ht 65.0 in | Wt 122.0 lb

## 2023-07-01 DIAGNOSIS — M3214 Glomerular disease in systemic lupus erythematosus: Secondary | ICD-10-CM

## 2023-07-01 DIAGNOSIS — M25521 Pain in right elbow: Secondary | ICD-10-CM

## 2023-07-01 DIAGNOSIS — Z840 Family history of diseases of the skin and subcutaneous tissue: Secondary | ICD-10-CM

## 2023-07-01 DIAGNOSIS — Z79899 Other long term (current) drug therapy: Secondary | ICD-10-CM | POA: Diagnosis not present

## 2023-07-01 DIAGNOSIS — Z8669 Personal history of other diseases of the nervous system and sense organs: Secondary | ICD-10-CM

## 2023-07-01 DIAGNOSIS — E559 Vitamin D deficiency, unspecified: Secondary | ICD-10-CM

## 2023-07-01 DIAGNOSIS — M25562 Pain in left knee: Secondary | ICD-10-CM

## 2023-07-01 DIAGNOSIS — N033 Chronic nephritic syndrome with diffuse mesangial proliferative glomerulonephritis: Secondary | ICD-10-CM

## 2023-07-01 DIAGNOSIS — M25522 Pain in left elbow: Secondary | ICD-10-CM

## 2023-07-01 DIAGNOSIS — M25561 Pain in right knee: Secondary | ICD-10-CM

## 2023-07-01 DIAGNOSIS — M25461 Effusion, right knee: Secondary | ICD-10-CM

## 2023-07-01 DIAGNOSIS — G8929 Other chronic pain: Secondary | ICD-10-CM

## 2023-07-01 DIAGNOSIS — I10 Essential (primary) hypertension: Secondary | ICD-10-CM

## 2023-07-01 NOTE — Progress Notes (Signed)
ESR is borderline elevated-22 but improving.

## 2023-07-01 NOTE — Progress Notes (Signed)
Hemoglobin and hematocrit remain low but have improved.   Rest of CBC WNL.

## 2023-07-02 LAB — PROTEIN / CREATININE RATIO, URINE
Creatinine, Urine: 240 mg/dL (ref 20–275)
Protein/Creat Ratio: 329 mg/g creat — ABNORMAL HIGH (ref 24–184)
Protein/Creatinine Ratio: 0.329 mg/mg creat — ABNORMAL HIGH (ref 0.024–0.184)
Total Protein, Urine: 79 mg/dL — ABNORMAL HIGH (ref 5–24)

## 2023-07-02 LAB — CBC WITH DIFFERENTIAL/PLATELET
Absolute Monocytes: 315 cells/uL (ref 200–950)
Basophils Absolute: 0 cells/uL (ref 0–200)
Basophils Relative: 0 %
Eosinophils Absolute: 30 cells/uL (ref 15–500)
Eosinophils Relative: 0.8 %
HCT: 33.8 % — ABNORMAL LOW (ref 35.0–45.0)
Hemoglobin: 11 g/dL — ABNORMAL LOW (ref 11.7–15.5)
Lymphs Abs: 901 cells/uL (ref 850–3900)
MCH: 28.3 pg (ref 27.0–33.0)
MCHC: 32.5 g/dL (ref 32.0–36.0)
MCV: 86.9 fL (ref 80.0–100.0)
MPV: 10.3 fL (ref 7.5–12.5)
Monocytes Relative: 8.3 %
Neutro Abs: 2554 cells/uL (ref 1500–7800)
Neutrophils Relative %: 67.2 %
Platelets: 235 10*3/uL (ref 140–400)
RBC: 3.89 10*6/uL (ref 3.80–5.10)
RDW: 12.5 % (ref 11.0–15.0)
Total Lymphocyte: 23.7 %
WBC: 3.8 10*3/uL (ref 3.8–10.8)

## 2023-07-02 LAB — COMPLETE METABOLIC PANEL WITH GFR
AG Ratio: 1.8 (calc) (ref 1.0–2.5)
ALT: 4 U/L — ABNORMAL LOW (ref 6–29)
AST: 10 U/L (ref 10–30)
Albumin: 4.8 g/dL (ref 3.6–5.1)
Alkaline phosphatase (APISO): 36 U/L (ref 31–125)
BUN: 13 mg/dL (ref 7–25)
CO2: 24 mmol/L (ref 20–32)
Calcium: 10 mg/dL (ref 8.6–10.2)
Chloride: 104 mmol/L (ref 98–110)
Creat: 0.69 mg/dL (ref 0.50–0.96)
Globulin: 2.6 g/dL (calc) (ref 1.9–3.7)
Glucose, Bld: 83 mg/dL (ref 65–99)
Potassium: 4 mmol/L (ref 3.5–5.3)
Sodium: 138 mmol/L (ref 135–146)
Total Bilirubin: 0.3 mg/dL (ref 0.2–1.2)
Total Protein: 7.4 g/dL (ref 6.1–8.1)
eGFR: 126 mL/min/{1.73_m2} (ref >=60)

## 2023-07-02 LAB — C3 AND C4
C3 Complement: 130 mg/dL (ref 83–193)
C4 Complement: 30 mg/dL (ref 15–57)

## 2023-07-02 LAB — SEDIMENTATION RATE: Sed Rate: 22 mm/h — ABNORMAL HIGH (ref 0–20)

## 2023-07-02 LAB — ANTI-DNA ANTIBODY, DOUBLE-STRANDED: ds DNA Ab: 8 IU/mL — ABNORMAL HIGH

## 2023-07-02 LAB — VITAMIN D 25 HYDROXY (VIT D DEFICIENCY, FRACTURES): Vit D, 25-Hydroxy: 33 ng/mL (ref 30–100)

## 2023-07-03 NOTE — Progress Notes (Signed)
Protein creatinine ratio remains borderline elevated but has improved.  Please forward results to [facility].  ESR remains borderline elevated-22-improved.  Complements WNL.  Vitamin D WNL. dsDNA is within the indeterminate range-8-continues to trend down on current treatment regimen. Labs are not consistent with a flare at this time.  Continue current treatment regimen.

## 2023-07-16 ENCOUNTER — Encounter: Payer: Self-pay | Admitting: Physician Assistant

## 2023-09-21 NOTE — Progress Notes (Signed)
Office Visit Note  Patient: Alexis Rice             Date of Birth: Oct 30, 2001           MRN: [id number]             PCP: Verlon Au, MD Referring: Verlon Au, MD Visit Date: 10/01/2023 Occupation: @GUAROCC @  Subjective:  Medication monitoring   History of Present Illness: Alexis Rice is a 22 y.o. female with history of systemic lupus.  Patient remains on CellCept 250 mg 2 tablets by mouth twice daily, Plaquenil 200 mg 1 tablet by mouth daily, and Benlysta 200 mg subcutaneous injections once weekly.  She is tolerating therapy without any side effects and has not had any interruptions in therapy.  She denies any signs or symptoms of a systemic lupus flare.  She denies any increased joint pain or joint swelling.  Her energy level has been stable.  She has been sleeping well at night.  She denies any appetite change or unintentional weight loss.  She denies any recent or recurrent infections.  She denies any sores in her mouth or nose.  She has not had any sicca symptoms.  She denies any hair loss or recent rashes.  She has had less frequent symptoms of Raynaud's phenomenon.  She denies any new medical conditions.  She will be having updated lab work ordered by nephrology next week.    Activities of Daily Living:  Patient reports morning stiffness for 0 minutes.   Patient Denies nocturnal pain.  Difficulty dressing/grooming: Denies Difficulty climbing stairs: Denies Difficulty getting out of chair: Denies Difficulty using hands for taps, buttons, cutlery, and/or writing: Denies  Review of Systems  Constitutional:  Negative for fatigue.  HENT:  Negative for mouth sores and mouth dryness.   Eyes:  Negative for dryness.  Respiratory:  Negative for shortness of breath.   Cardiovascular:  Negative for chest pain and palpitations.  Gastrointestinal:  Negative for blood in stool, constipation and diarrhea.  Endocrine: Negative for increased urination.  Genitourinary:   Negative for involuntary urination.  Musculoskeletal:  Negative for joint pain, gait problem, joint pain, joint swelling, myalgias, muscle weakness, morning stiffness, muscle tenderness and myalgias.  Skin:  Negative for color change, rash, hair loss and sensitivity to sunlight.  Allergic/Immunologic: Negative for susceptible to infections.  Neurological:  Positive for headaches. Negative for dizziness.  Hematological:  Negative for swollen glands.  Psychiatric/Behavioral:  Negative for depressed mood and sleep disturbance. The patient is not nervous/anxious.     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   Diarrhea 03/27/2022   Anasarca 03/27/2022   Chest pain/palpitation/anxiety 03/27/2022   Palpitation 03/27/2022   Elevated troponin 03/27/2022   Pleural effusion on left 03/27/2022   Anxiety 03/27/2022   Systemic lupus erythematosus (HCC) 03/21/2022   Chronic (diffuse) proliferative glomerulonephritis Class IV, membranous glomerulopathy classV 03/21/2022   High risk medication use 03/21/2022   Family history of lupus erythematosus 03/21/2022   Hx of migraines 03/21/2022   Essential hypertension 02/25/2022   Poor appetite 09/28/2013   Nausea     Past Medical History:  Diagnosis Date   Collagen vascular disease (HCC)    Hypertension    Kidney disease    stage 2 CKD   Lupus    Nausea    Reduced Appetite    Family History  Problem Relation Age of Onset  Ulcers Father    Asthma Sister    Lupus Sister    Cholelithiasis Maternal Aunt    Past Surgical History:  Procedure Laterality Date   RENAL BIOPSY Left    Social History   Social History Narrative   7th grade 2014-2015   Immunization History  Administered Date(s) Administered   Research officer, trade union 79yrs & up 01/11/2022     Objective: Vital Signs: BP 112/79 (BP Location: Left Arm, Patient Position: Sitting,  Cuff Size: Normal)   Pulse 82   Resp 14   Ht 5\' 5"  (1.651 m)   Wt 123 lb 6.4 oz (56 kg)   BMI 20.53 kg/m    Physical Exam Vitals and nursing note reviewed.  Constitutional:      Appearance: She is well-developed.  HENT:     Head: Normocephalic and atraumatic.     Mouth/Throat:     Comments: No parotid swelling Eyes:     Conjunctiva/sclera: Conjunctivae normal.  Cardiovascular:     Rate and Rhythm: Normal rate and regular rhythm.     Heart sounds: Normal heart sounds.  Pulmonary:     Effort: Pulmonary effort is normal.     Breath sounds: Normal breath sounds.  Abdominal:     General: Bowel sounds are normal.     Palpations: Abdomen is soft.  Musculoskeletal:     Cervical back: Normal range of motion.  Lymphadenopathy:     Cervical: No cervical adenopathy.  Skin:    General: Skin is warm and dry.     Capillary Refill: Capillary refill takes less than 2 seconds.     Comments: No malar rash Striae noted on lower extremities-secondary to previous prednisone use.  Neurological:     Mental Status: She is alert and oriented to person, place, and time.  Psychiatric:        Behavior: Behavior normal.      Musculoskeletal Exam: C-spine, thoracic spine, lumbar spine have good range of motion.  Shoulder joints, elbow joints, wrist joints, MCPs, PIPs, DIPs have good range of motion with no synovitis.  Complete fist formation noted bilaterally.  Hip joints have good range of motion with no groin pain.  Knee joints have good range of motion with no warmth or effusion.  Ankle joints have good range of motion with no tenderness or joint swelling. No LE edema noted.   CDAI Exam: CDAI Score: -- Patient Global: --; Provider Global: -- Swollen: --; Tender: -- Joint Exam 10/01/2023   No joint exam has been documented for this visit   There is currently no information documented on the homunculus. Go to the Rheumatology activity and complete the homunculus joint  exam.  Investigation: No additional findings.  Imaging: No results found.  Recent Labs: Lab Results  Component Value Date   WBC 3.8 07/01/2023   HGB 11.0 (L) 07/01/2023   PLT 235 07/01/2023   NA 138 07/01/2023   K 4.0 07/01/2023   CL 104 07/01/2023   CO2 24 07/01/2023   GLUCOSE 83 07/01/2023   BUN 13 07/01/2023   CREATININE 0.69 07/01/2023   BILITOT 0.3 07/01/2023   ALKPHOS 39 05/02/2022   AST 10 07/01/2023   ALT 4 (L) 07/01/2023   PROT 7.4 07/01/2023   ALBUMIN 3.3 (L) 05/02/2022   CALCIUM 10.0 07/01/2023    Speciality Comments: PLQ Eye Exam 06/16/2023 normal [facility]. f/u 12 months  Procedures:  No procedures performed Allergies: Patient has no known allergies.    Assessment /  Plan:     Visit Diagnoses: SLE:+ANA,+ds,RNP,+Ro,+Sm,lowC3, fatigue, arthralgias, malar rash, DPGN - She has not had any signs or symptoms of a systemic lupus flare.  She has clinically been doing well taking CellCept 250 mg 2 tablets by mouth twice daily, Plaquenil 200 mg 1 tablet by mouth daily, and Benlysta 200 mg sq injections once weekly.  She is tolerating triple therapy without any side effects and has not had any interruptions in therapy.  She has no synovitis on examination today.  Her energy level has been stable.  She has not had any appetite changes or unintentional weight loss.  She has been sleeping well and her stress levels have been manageable.  She has not had any recent rashes, alopecia, oral or nasal ulcerations, sicca symptoms, or cervical lymphadenopathy.  No Malar rash was noted on examination today.  Her lungs were clear to auscultation.  She has not had any shortness of breath, palpitations, or pleuritic chest pain.  She has had infrequent symptoms of Raynaud's phenomenon.  Capillary refills less than 2 seconds today.  No digital ulcerations or signs of sclerodactyly noted. Lab work from 07/01/23 was reviewed today in the office: ESR 22, vitamin D WNL,  complements WNL, dsDNA 8.   She will be having updated lab work ordered by nephrology next week.  Patient was provided additional orders to take with her to have drawn at the same time. She will remain on triple therapy as prescribed.  She was vies notify us if she develops signs or symptoms of a flare.  She will follow-up in the office in 3 months or sooner if needed. Plan: Protein / creatinine ratio, urine, Anti-DNA antibody, double-stranded, C3 and C4, Sedimentation rate  Chronic (diffuse) proliferative glomerulonephritis Class IV, membranous glomerulopathy classV - Followed by Dr. Anthony Sar.  She remains on CellCept, Plaquenil, and Benlysta as prescribed. She will be having updated lab work next week.    High risk medication use - CellCept 250 mg 2 tablets by mouth twice daily, Plaquenil 200 mg 1 tablet by mouth daily, and Benlysta 200 mg subcutaneous injections once weekly. CBC and CMP updated on 07/01/23. She is planning to have updated lab work next week.   - Plan: CBC with Differential/Platelet, CMP14+EGFR  Pain of both elbows: Resolved.  No recurrence.  No tenderness or inflammation.   Effusion, right knee: No recurrence.  No warmth or effusion noted on exam.   Chronic pain of both knees:  No warmth or effusion noted.   Other medical conditions are listed as follows:   Family history of lupus erythematosus-Sister  Essential hypertension: BP was 112/79 today in the office.   Vitamin D deficiency -Future order for vitamin D placed today.  Plan: VITAMIN D 25 Hydroxy (Vit-D Deficiency, Fractures)  Hx of migraines  Orders: Orders Placed This Encounter  Procedures   CBC with Differential/Platelet   Protein / creatinine ratio, urine   Anti-DNA antibody, double-stranded   C3 and C4   Sedimentation rate   VITAMIN D 25 Hydroxy (Vit-D Deficiency, Fractures)   CMP14+EGFR   No orders of the defined types were placed in this encounter.   Follow-Up Instructions: Return in about 3 months (around 01/01/2024)  for Systemic lupus erythematosus.   Gearldine Bienenstock, PA-C  Note - This record has been created using Dragon software.  Chart creation errors have been sought, but may not always  have been located. Such creation errors do not reflect on  the standard of medical care.

## 2023-10-01 ENCOUNTER — Ambulatory Visit: Payer: 59 | Attending: Physician Assistant | Admitting: Physician Assistant

## 2023-10-01 ENCOUNTER — Encounter: Payer: Self-pay | Admitting: Physician Assistant

## 2023-10-01 VITALS — BP 112/79 | HR 82 | Resp 14 | Ht 65.0 in | Wt 123.4 lb

## 2023-10-01 DIAGNOSIS — N033 Chronic nephritic syndrome with diffuse mesangial proliferative glomerulonephritis: Secondary | ICD-10-CM | POA: Diagnosis not present

## 2023-10-01 DIAGNOSIS — M25461 Effusion, right knee: Secondary | ICD-10-CM

## 2023-10-01 DIAGNOSIS — M3214 Glomerular disease in systemic lupus erythematosus: Secondary | ICD-10-CM | POA: Diagnosis not present

## 2023-10-01 DIAGNOSIS — Z79899 Other long term (current) drug therapy: Secondary | ICD-10-CM | POA: Diagnosis not present

## 2023-10-01 DIAGNOSIS — M25521 Pain in right elbow: Secondary | ICD-10-CM

## 2023-10-01 DIAGNOSIS — E559 Vitamin D deficiency, unspecified: Secondary | ICD-10-CM

## 2023-10-01 DIAGNOSIS — G8929 Other chronic pain: Secondary | ICD-10-CM

## 2023-10-01 DIAGNOSIS — Z840 Family history of diseases of the skin and subcutaneous tissue: Secondary | ICD-10-CM

## 2023-10-01 DIAGNOSIS — M25522 Pain in left elbow: Secondary | ICD-10-CM

## 2023-10-01 DIAGNOSIS — Z8669 Personal history of other diseases of the nervous system and sense organs: Secondary | ICD-10-CM

## 2023-10-01 DIAGNOSIS — M25561 Pain in right knee: Secondary | ICD-10-CM

## 2023-10-01 DIAGNOSIS — M25562 Pain in left knee: Secondary | ICD-10-CM

## 2023-10-01 DIAGNOSIS — I10 Essential (primary) hypertension: Secondary | ICD-10-CM

## 2023-10-06 IMAGING — US US BIOPSY
1 series · 13 of 17 positions shown · non-contrast
Comparison: none

INDICATION: Renal failure

[Series 1: us biopsy (kidney) · 17 acquisitions, 13 frames shown]
[im 1/17]
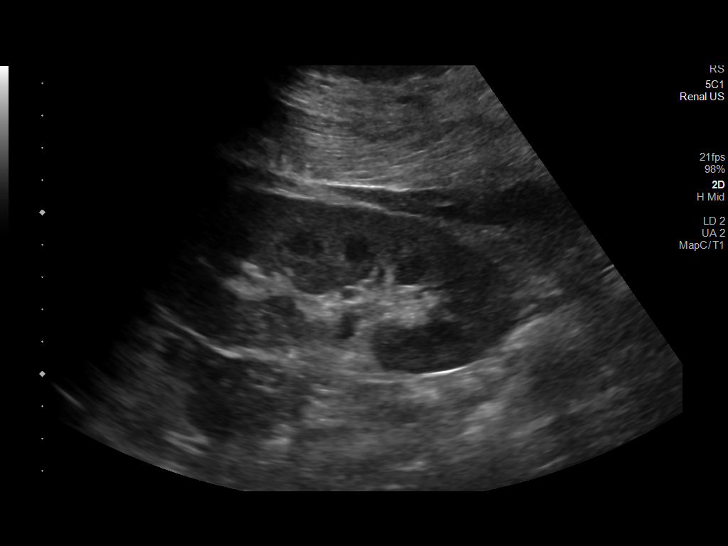
[im 2/17]
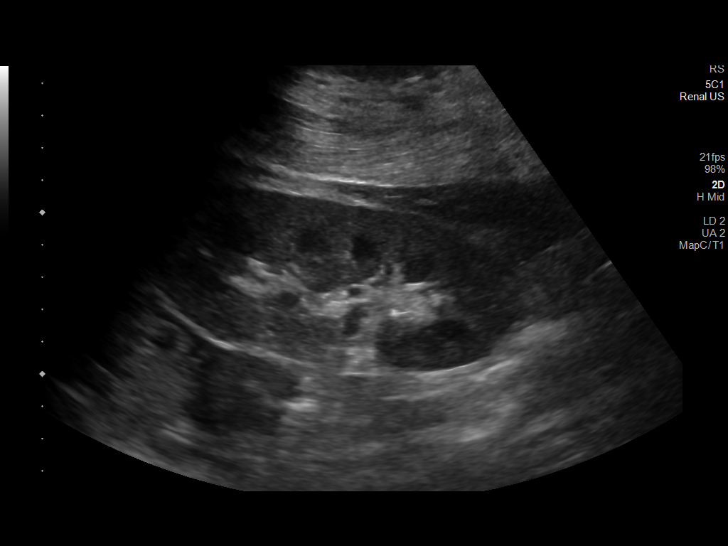
[im 4/17]
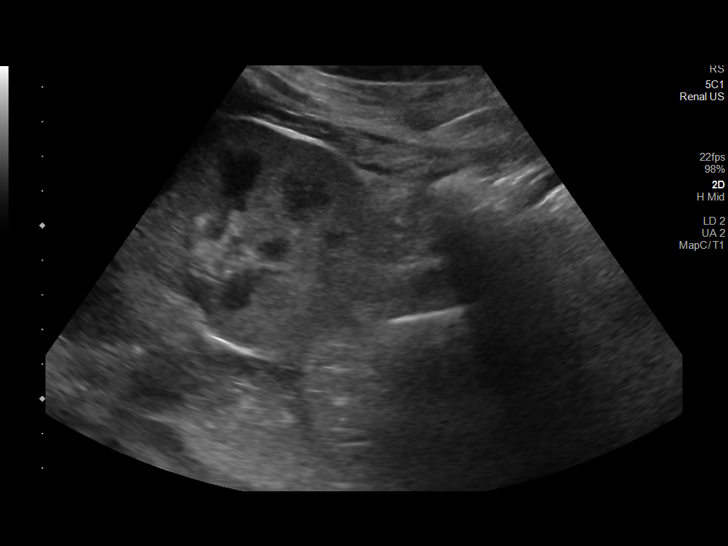
[im 5/17]
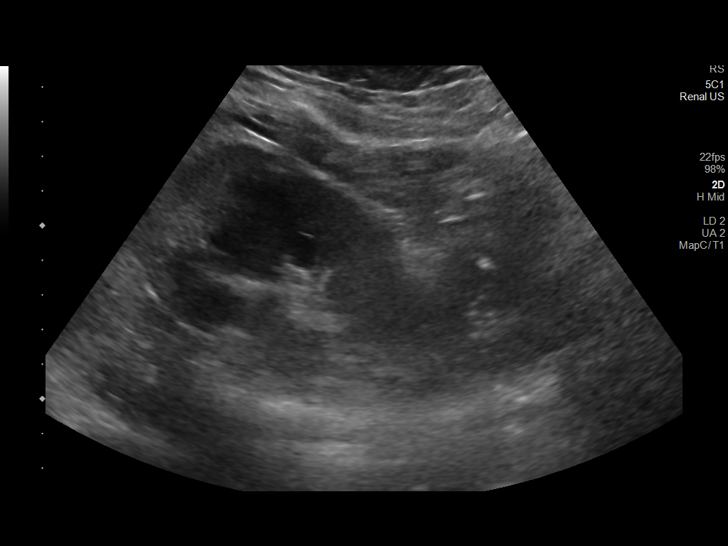
[im 6/17]
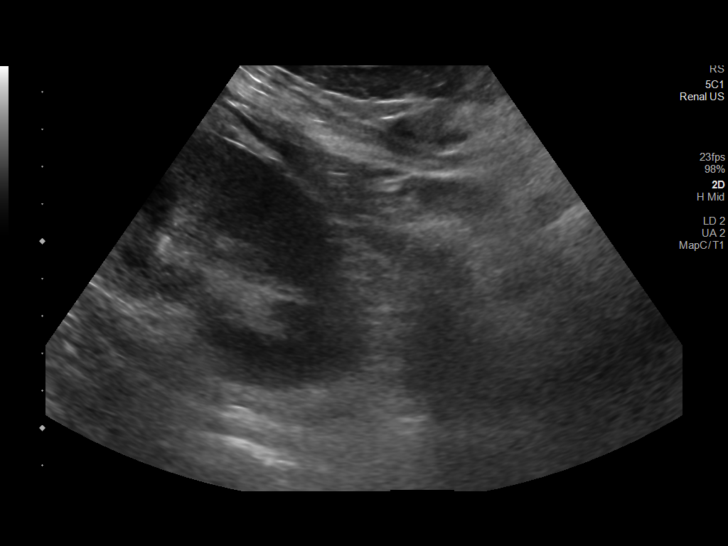
[im 8/17]
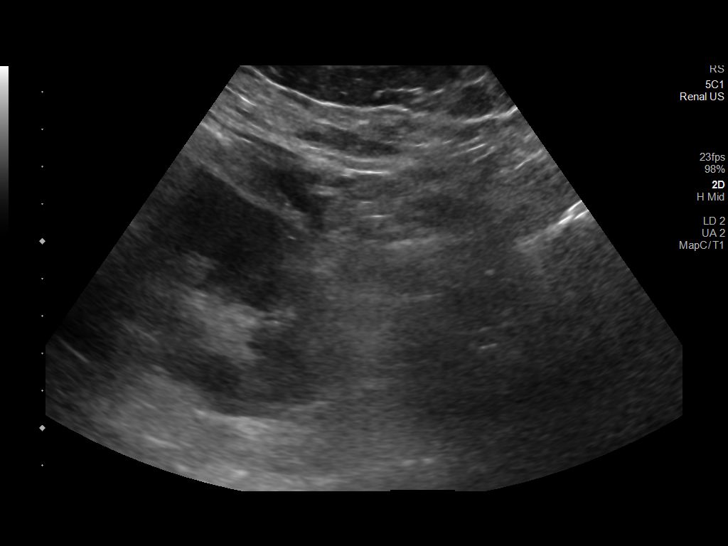
[im 9/17]
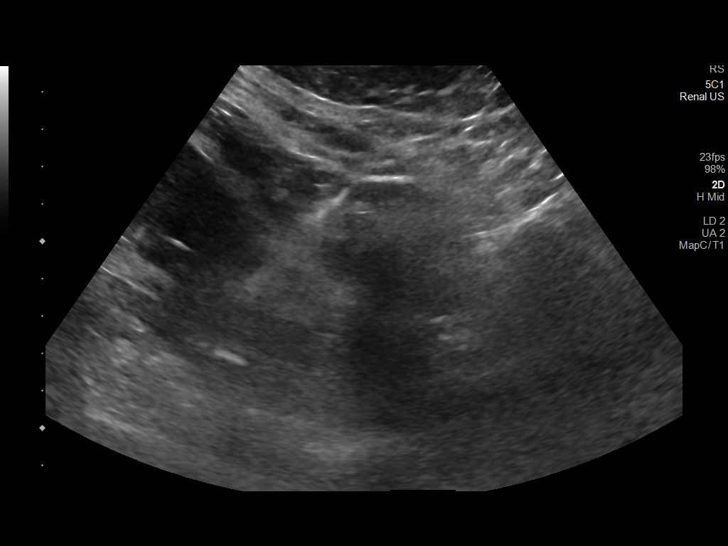
[im 10/17]
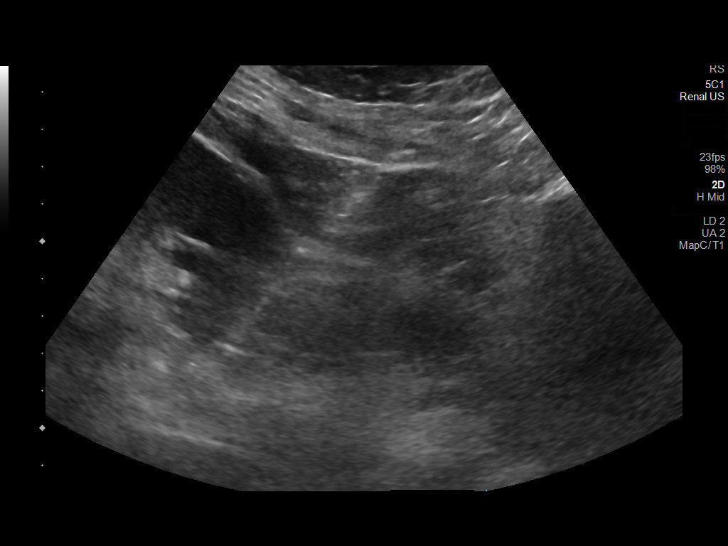
[im 12/17]
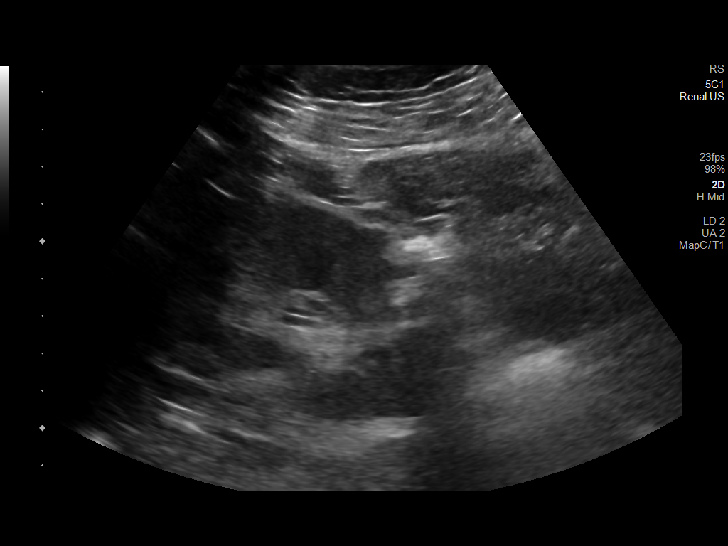
[im 13/17]
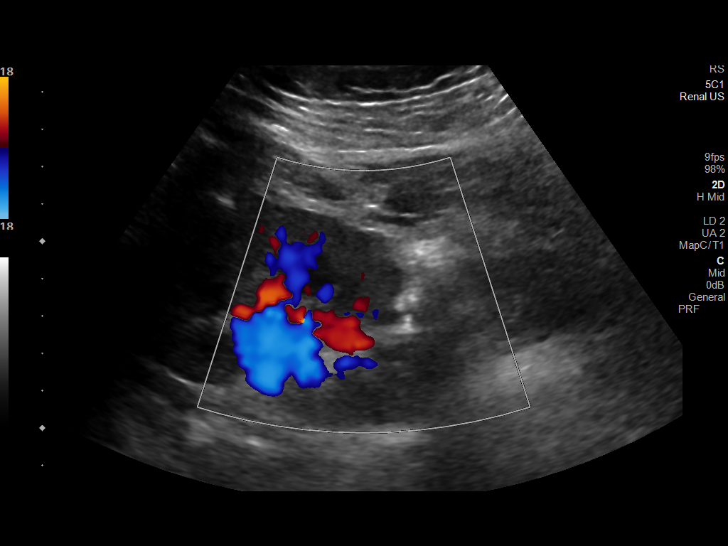
[im 14/17]
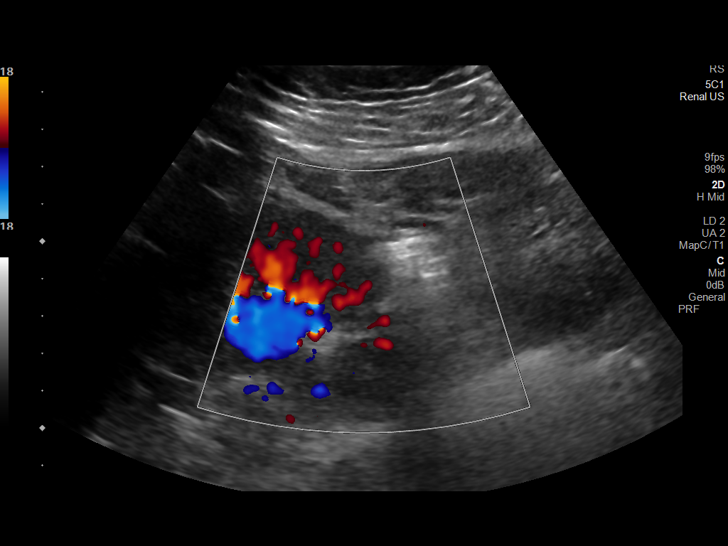
[im 16/17]
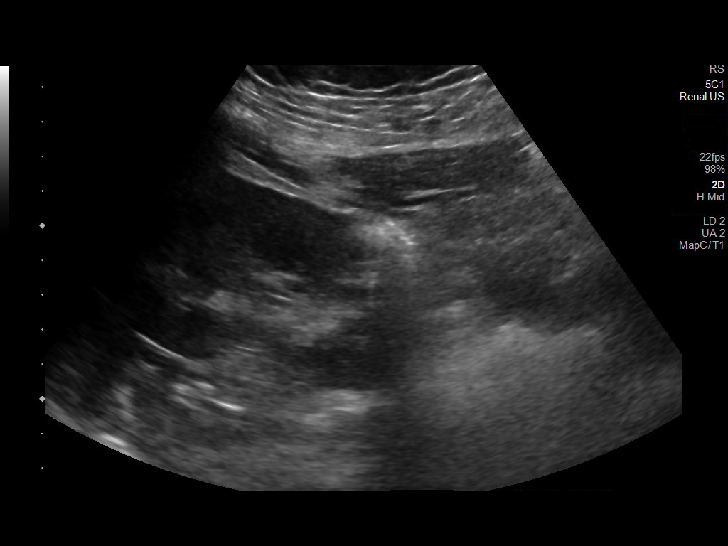
[im 17/17]
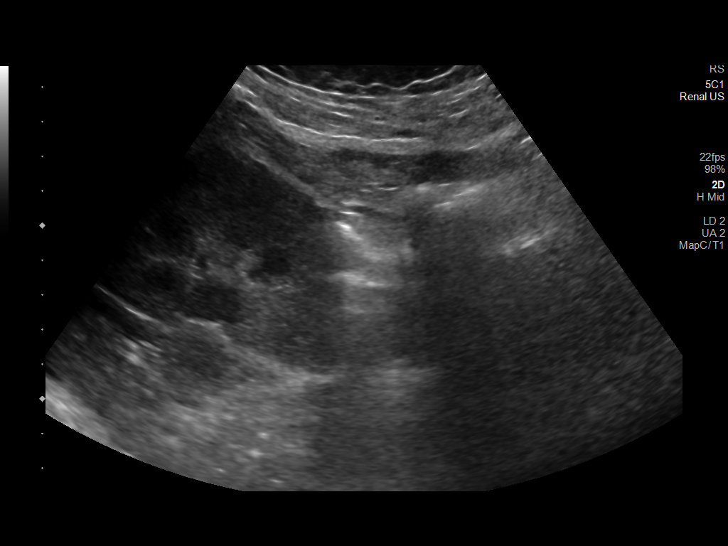

[13 of 17 positions shown; findings below may reference images not displayed]

EXAM:
Ultrasound-guided random renal biopsy

MEDICATIONS:
None.

ANESTHESIA/SEDATION:
Moderate (conscious) sedation was employed during this procedure. A
total of Versed 1.5 mg and Fentanyl 75 mcg was administered
intravenously by the radiology nurse.

Total intra-service moderate Sedation Time: 15 minutes. The
patient's level of consciousness and vital signs were monitored
continuously by radiology nursing throughout the procedure under my
direct supervision.

COMPLICATIONS:
None immediate.

PROCEDURE:
Informed written consent was obtained from the patient after a
thorough discussion of the procedural risks, benefits and
alternatives. All questions were addressed. Maximal Sterile Barrier
Technique was utilized including caps, mask, sterile gowns, sterile
gloves, sterile drape, hand hygiene and skin antiseptic. A timeout
was performed prior to the initiation of the procedure.

Patient position prone on the ultrasound table.

Left flank skin prepped and draped in usual sterile fashion.

Following local lidocaine administration, 15 gauge introducer needle
was advanced into the lower pole the left kidney, and 2- 16 gauge
cores were obtained utilizing continuous ultrasound guidance.

Gelfoam slurry was administered through the introducer needle at the
biopsy site.

Samples were sent to pathology in sterile saline.

Needle removed and hemostasis achieved with 5 minutes of manual
compression.

Post procedure ultrasound images showed no evidence of significant
hemorrhage.
IMPRESSION: Ultrasound-guided random biopsy of the lower pole of the left
kidney.

## 2023-10-10 LAB — CBC WITH DIFFERENTIAL/PLATELET
Basophils Absolute: 0 10*3/uL (ref 0.0–0.2)
Basos: 0 %
EOS (ABSOLUTE): 0.1 10*3/uL (ref 0.0–0.4)
Eos: 2 %
Hematocrit: 34.7 % (ref 34.0–46.6)
Hemoglobin: 10.9 g/dL — ABNORMAL LOW (ref 11.1–15.9)
Immature Grans (Abs): 0 10*3/uL (ref 0.0–0.1)
Immature Granulocytes: 0 %
Lymphocytes Absolute: 1.1 10*3/uL (ref 0.7–3.1)
Lymphs: 33 %
MCH: 27.6 pg (ref 26.6–33.0)
MCHC: 31.4 g/dL — ABNORMAL LOW (ref 31.5–35.7)
MCV: 88 fL (ref 79–97)
Monocytes Absolute: 0.3 10*3/uL (ref 0.1–0.9)
Monocytes: 10 %
Neutrophils Absolute: 1.7 10*3/uL (ref 1.4–7.0)
Neutrophils: 55 %
Platelets: 268 10*3/uL (ref 150–450)
RBC: 3.95 x10E6/uL (ref 3.77–5.28)
RDW: 12.3 % (ref 11.7–15.4)
WBC: 3.2 10*3/uL — ABNORMAL LOW (ref 3.4–10.8)

## 2023-10-10 LAB — CMP14+EGFR
ALT: 5 [IU]/L (ref 0–32)
AST: 15 [IU]/L (ref 0–40)
Albumin: 4.6 g/dL (ref 4.0–5.0)
Alkaline Phosphatase: 45 [IU]/L (ref 44–121)
BUN/Creatinine Ratio: 14 (ref 9–23)
BUN: 10 mg/dL (ref 6–20)
Bilirubin Total: <0.2 mg/dL (ref 0.0–1.2)
CO2: 24 mmol/L (ref 20–29)
Calcium: 9.6 mg/dL (ref 8.7–10.2)
Chloride: 104 mmol/L (ref 96–106)
Creatinine, Ser: 0.7 mg/dL (ref 0.57–1.00)
Globulin, Total: 2.5 g/dL (ref 1.5–4.5)
Glucose: 95 mg/dL (ref 70–99)
Potassium: 4.2 mmol/L (ref 3.5–5.2)
Sodium: 141 mmol/L (ref 134–144)
Total Protein: 7.1 g/dL (ref 6.0–8.5)
eGFR: 125 mL/min/{1.73_m2} (ref >59)

## 2023-10-10 LAB — VITAMIN D 25 HYDROXY (VIT D DEFICIENCY, FRACTURES): Vit D, 25-Hydroxy: 31.8 ng/mL (ref 30.0–100.0)

## 2023-10-10 LAB — PROTEIN / CREATININE RATIO, URINE
Creatinine, Urine: 299.4 mg/dL
Protein, Ur: 76.1 mg/dL
Protein/Creat Ratio: 254 mg/g{creat} — ABNORMAL HIGH (ref 0–200)

## 2023-10-10 LAB — C3 AND C4
Complement C3, Serum: 125 mg/dL (ref 82–167)
Complement C4, Serum: 32 mg/dL (ref 12–38)

## 2023-10-10 LAB — SEDIMENTATION RATE: Sed Rate: 11 mm/h (ref 0–32)

## 2023-10-10 LAB — ANTI-DNA ANTIBODY, DOUBLE-STRANDED: dsDNA Ab: 11 [IU]/mL — ABNORMAL HIGH (ref 0–9)

## 2023-10-12 ENCOUNTER — Other Ambulatory Visit: Payer: Self-pay | Admitting: *Deleted

## 2023-10-12 DIAGNOSIS — N033 Chronic nephritic syndrome with diffuse mesangial proliferative glomerulonephritis: Secondary | ICD-10-CM

## 2023-10-12 DIAGNOSIS — M3214 Glomerular disease in systemic lupus erythematosus: Secondary | ICD-10-CM

## 2023-10-12 DIAGNOSIS — Z79899 Other long term (current) drug therapy: Secondary | ICD-10-CM

## 2023-10-12 NOTE — Progress Notes (Signed)
CMP WNL WBC count is low-3.2.   Hemoglobin remains borderline low. Rest of CBC WNL. Recommend rechecking CBC with diff in 1 month.  Complements WNL ESR WNL dsDNA remains borderline positive.  Protein creatinine ratio remains elevated but continues to trend down.  Please forward results to her nephrologist.  Vitamin D WNL.

## 2023-11-09 IMAGING — CT CT ABD-PELV W/ CM
2 of 4 series · 16 of 46 positions shown, 18 images · IV contrast (APPLIED)
Comparison: None.

CLINICAL DATA: Abdominal pain, acute, nonlocalized. Right lower
quadrant abdominal pain, diarrhea. Lupus.

EXAM:
CT ABDOMEN AND PELVIS WITH CONTRAST
TECHNIQUE: Multidetector CT imaging of the abdomen and pelvis was performed
using the standard protocol following bolus administration of
intravenous contrast.

[Series 2: abd pel w · axial · 0.60mm/px · z∈[+759,+1149]mm · 13 of 86 slices shown, 15 images]
[im 4/86  soft-tissue]
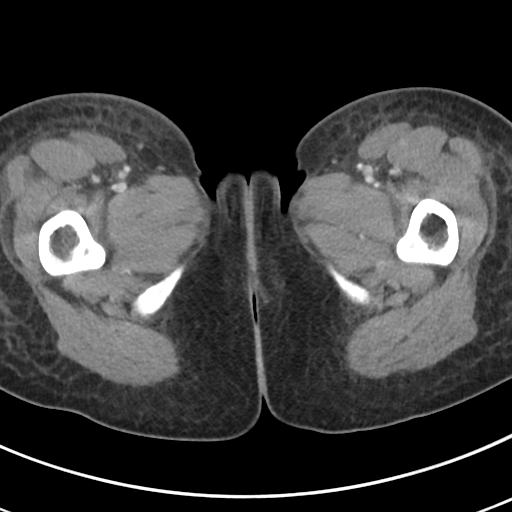
[im 4/86  bone]
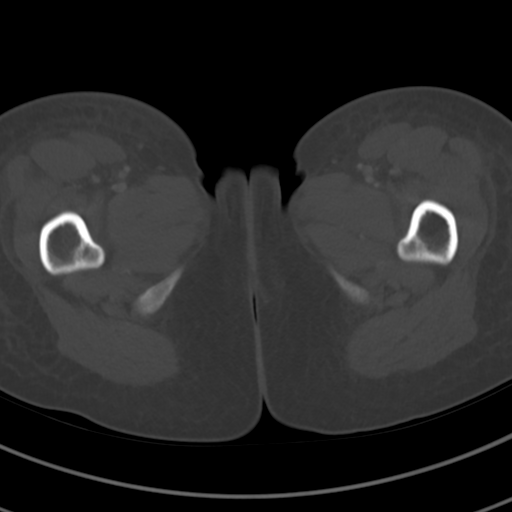
[im 11/86  soft-tissue]
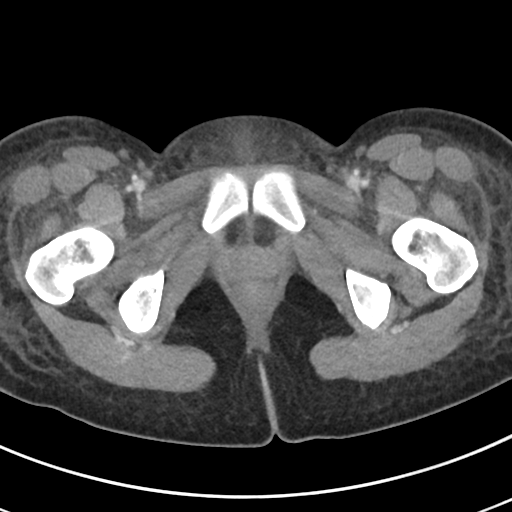
[im 18/86  soft-tissue]
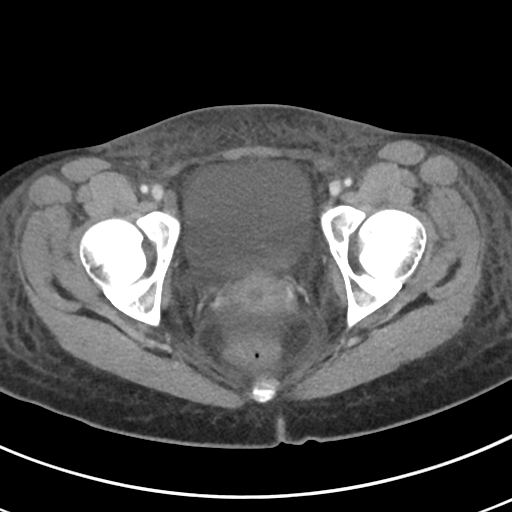
[im 24/86  soft-tissue]
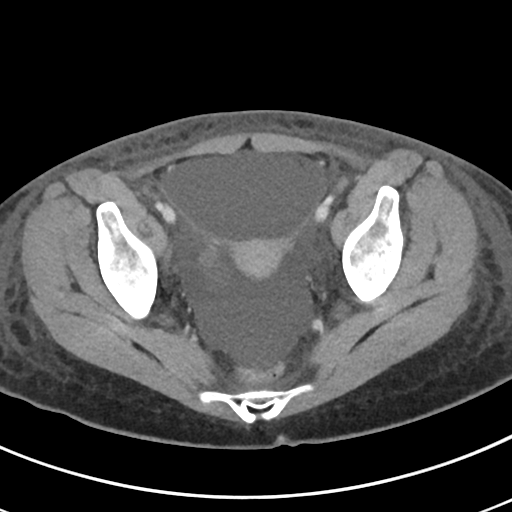
[im 31/86  soft-tissue]
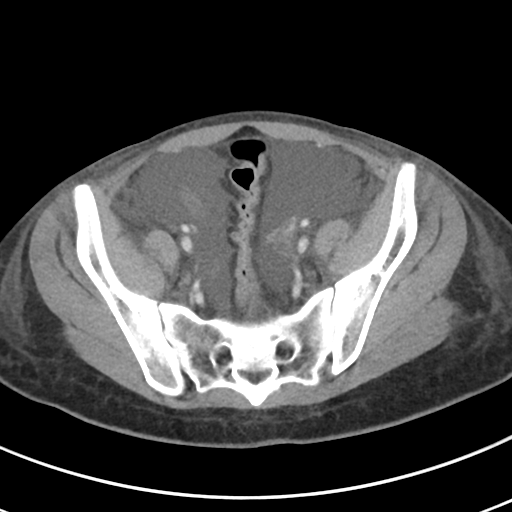
[im 38/86  soft-tissue]
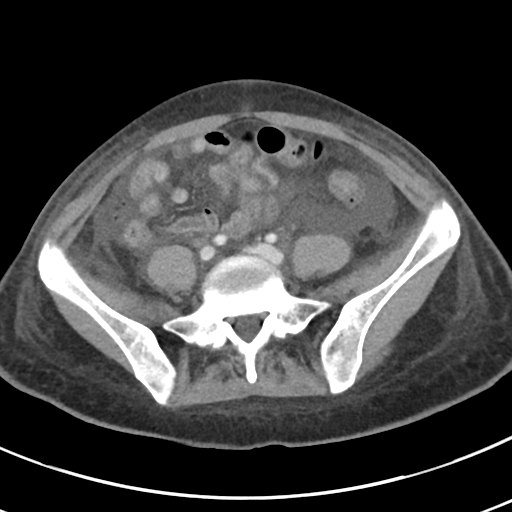
[im 45/86  soft-tissue]
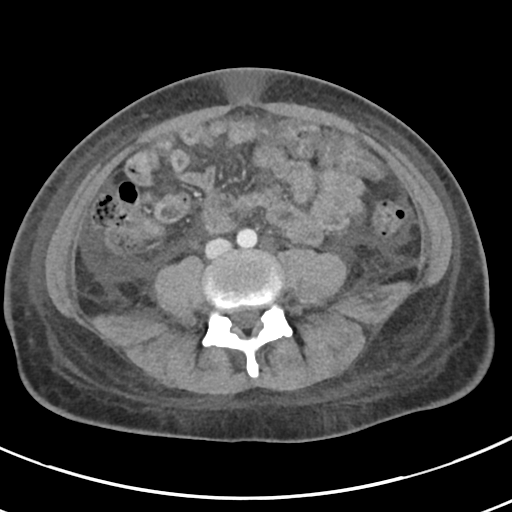
[im 48/86  soft-tissue]
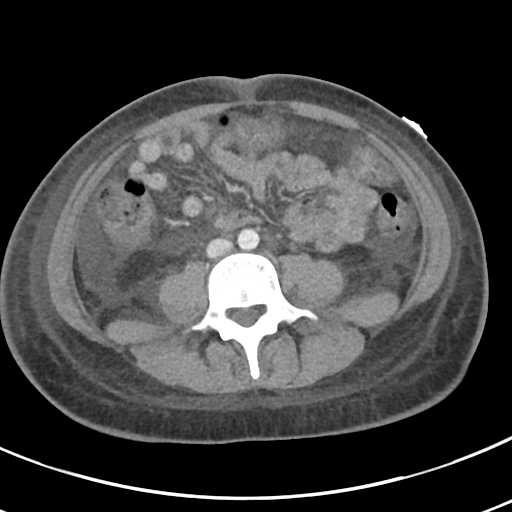
[im 55/86  soft-tissue]
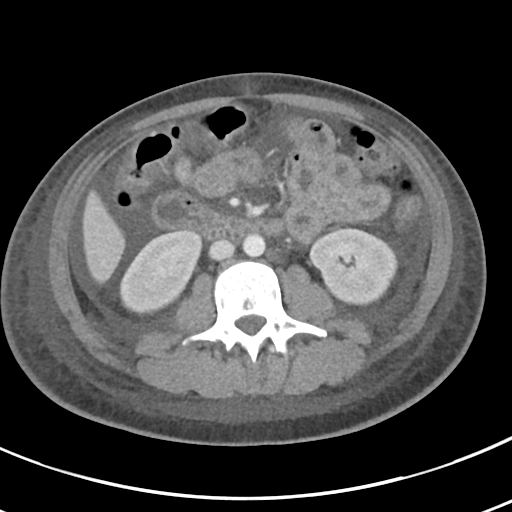
[im 55/86  bone]
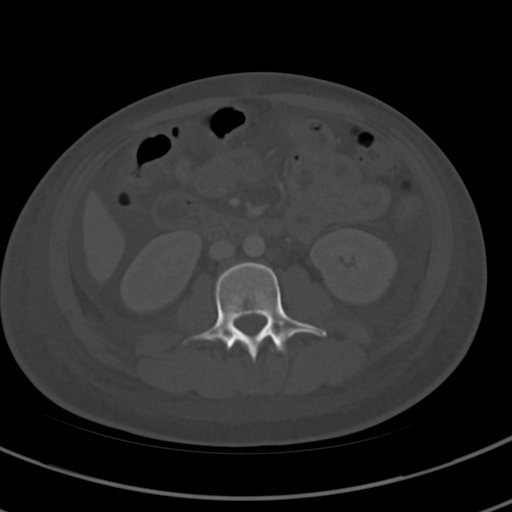
[im 62/86  soft-tissue]
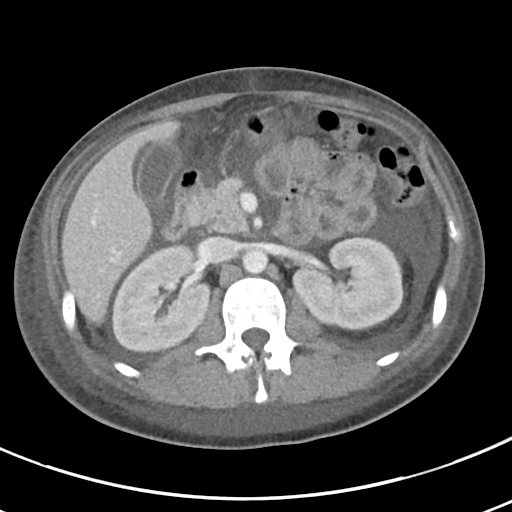
[im 69/86  soft-tissue]
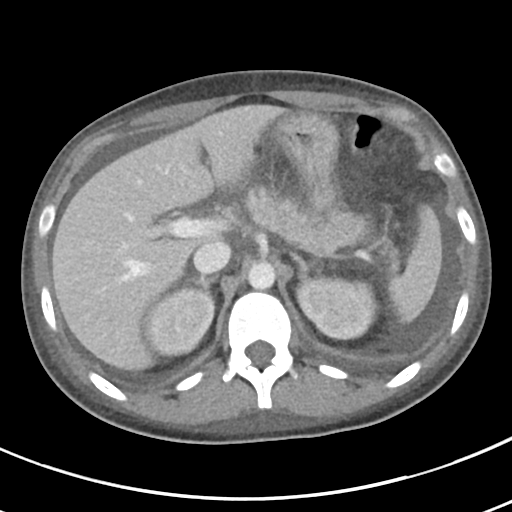
[im 75/86  soft-tissue]
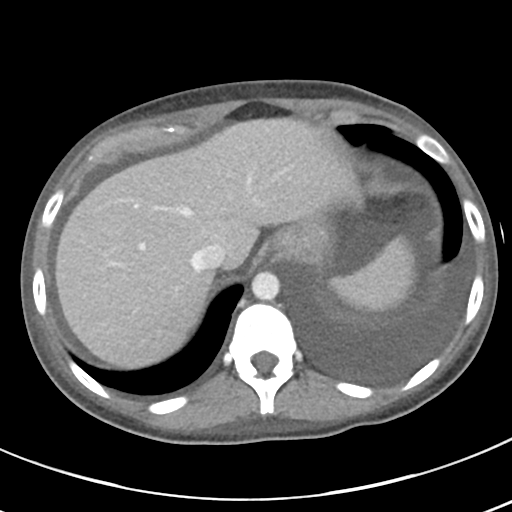
[im 82/86  soft-tissue]
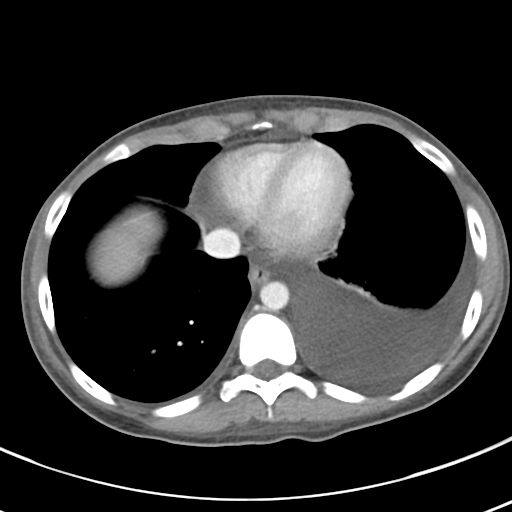

[Series 5: coronal · coronal · 0.73mm/px · 3 of 78 slices shown]
[im 26/78  soft-tissue]
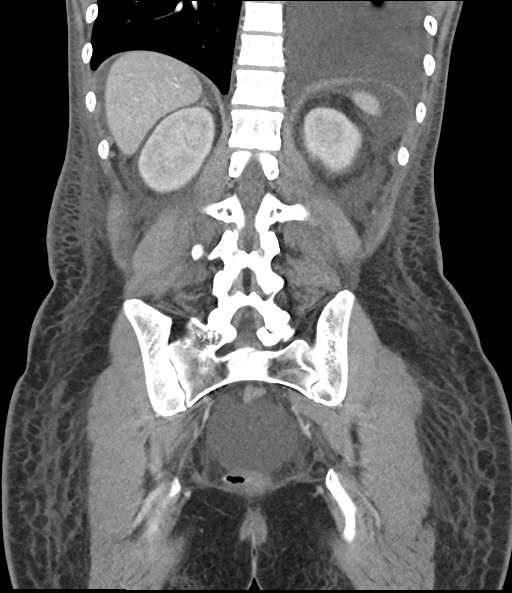
[im 35/78  soft-tissue]
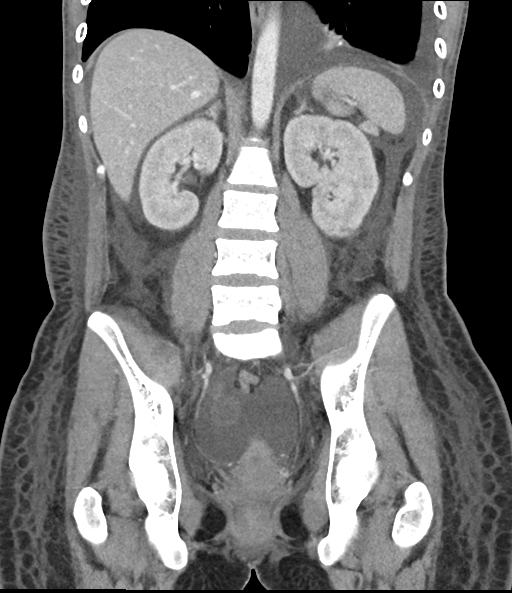
[im 43/78  soft-tissue]
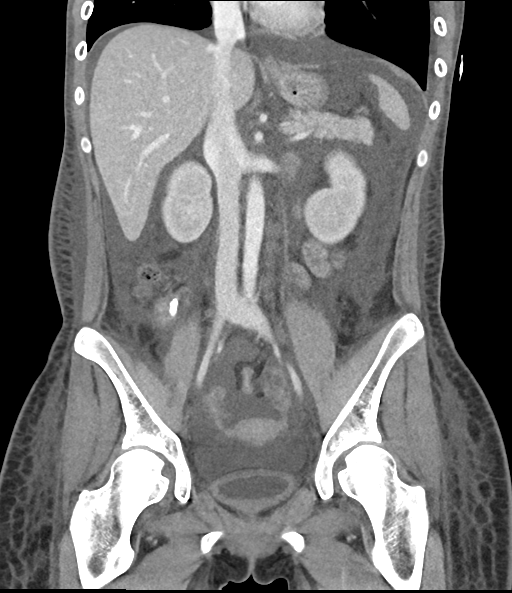

[16 of 46 positions shown; findings below may reference images not displayed]

RADIATION DOSE REDUCTION: This exam was performed according to the
departmental dose-optimization program which includes automated
exposure control, adjustment of the mA and/or kV according to
patient size and/or use of iterative reconstruction technique.

CONTRAST:  80mL OMNIPAQUE IOHEXOL 300 MG/ML  SOLN
FINDINGS: Lower chest: At least moderate left pleural effusion. Cardiac size
within normal limits.

Hepatobiliary: No focal liver abnormality is seen. No gallstones,
gallbladder wall thickening, or biliary dilatation.

Pancreas: Unremarkable

Spleen: Unremarkable

Adrenals/Urinary Tract: The adrenal glands are unremarkable. The
kidneys are normal in size and position. There is an unusual
enhancement pattern of the kidneys bilaterally with relatively low
attenuation of the subcapsular cortices which may relate to
parenchymal edema. Preserved cortical thickness. No hydronephrosis.
No enhancing intrarenal masses. No intrarenal or ureteral calculi.
The bladder is unremarkable.

Stomach/Bowel: Mild ascites. The stomach, small bowel, and large
bowel are unremarkable. Appendix normal. No free intraperitoneal
gas.

Vascular/Lymphatic: No significant vascular findings are present. No
enlarged abdominal or pelvic lymph nodes.

Reproductive: 23 mm simple appearing cyst within the right ovary. No
follow-up imaging is recommended in a patient of this age. The
pelvic organs are otherwise unremarkable.

Other: There is moderate diffuse subcutaneous body wall edema. No
abdominal wall hernia.

Musculoskeletal: No acute bone abnormality. No lytic or blastic bone
lesion.
IMPRESSION: Anasarca with mild ascites, diffuse body wall subcutaneous edema,
and moderate left pleural effusion.

Unusual enhancement pattern of the kidneys most suggestive of an
underlying inflammatory process with resultant cortical edema. This
may relate to the patient's underlying connective tissue disorder
and reflect changes of lupus nephritis.

2.3 cm right ovarian simple-appearing cyst. No follow-up imaging is
recommended.

Reference: JACR [DATE]):[id number]

## 2023-11-10 IMAGING — US US EXTREM LOW VENOUS
1 series · 14 of 24 positions shown · non-contrast
Comparison: None.

CLINICAL DATA: Edema

EXAM:
BILATERAL LOWER EXTREMITY VENOUS DOPPLER ULTRASOUND
TECHNIQUE: Gray-scale sonography with compression, as well as color and duplex
ultrasound, were performed to evaluate the deep venous system(s)
from the level of the common femoral vein through the popliteal and
proximal calf veins.

[Series 1: us venous img lower bilat (dvt) · portal-venous · 14 of 59 slices shown]
[im 1/59]
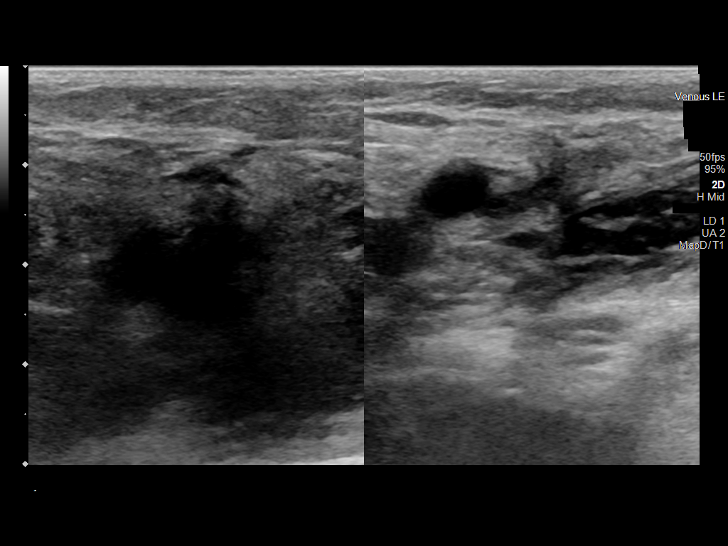
[im 6/59]
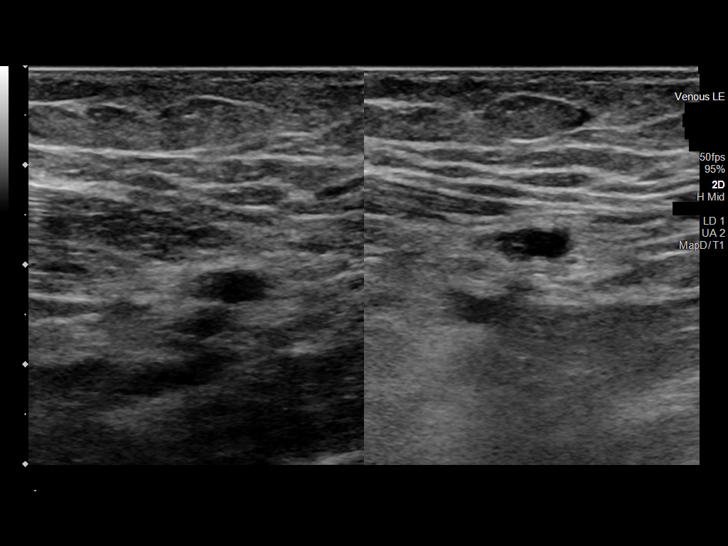
[im 11/59]
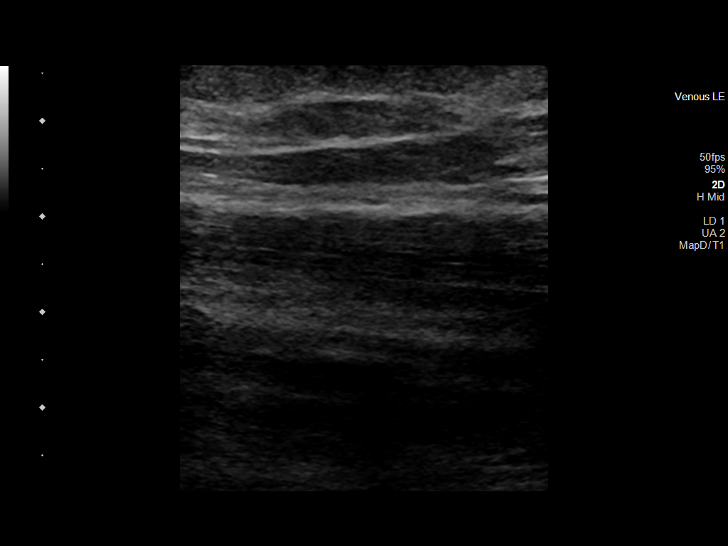
[im 16/59]
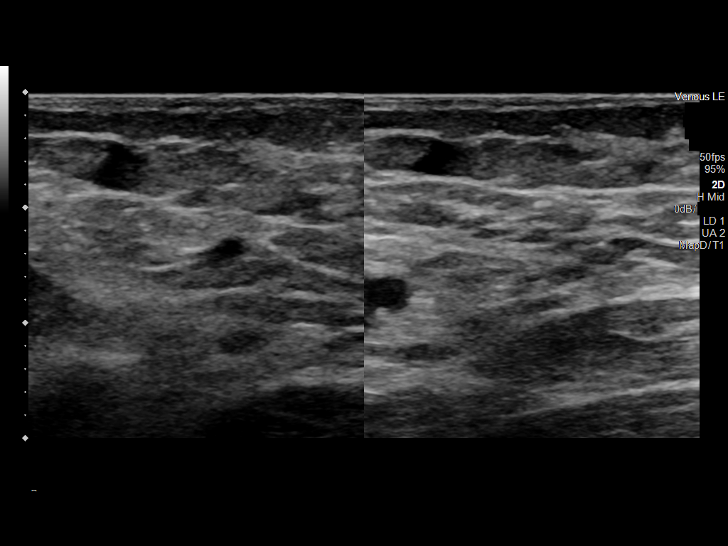
[im 18/59]
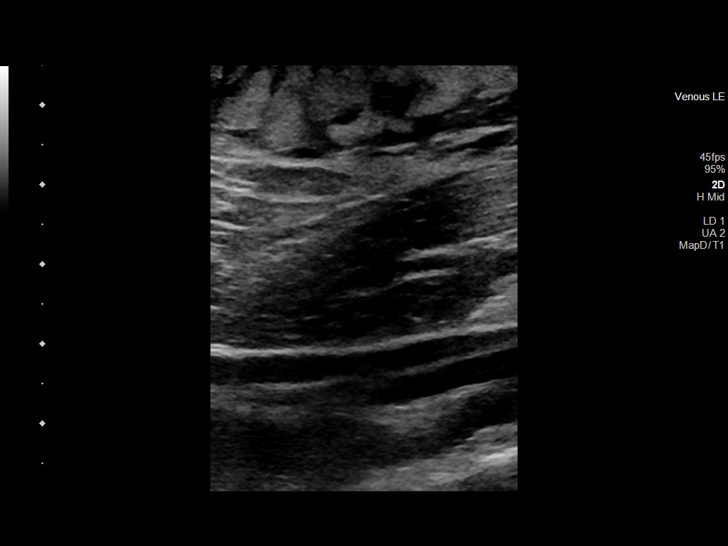
[im 23/59]
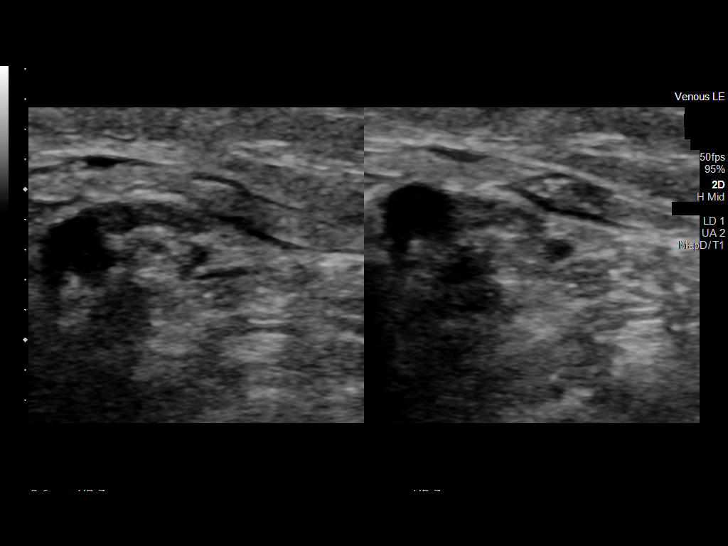
[im 28/59]
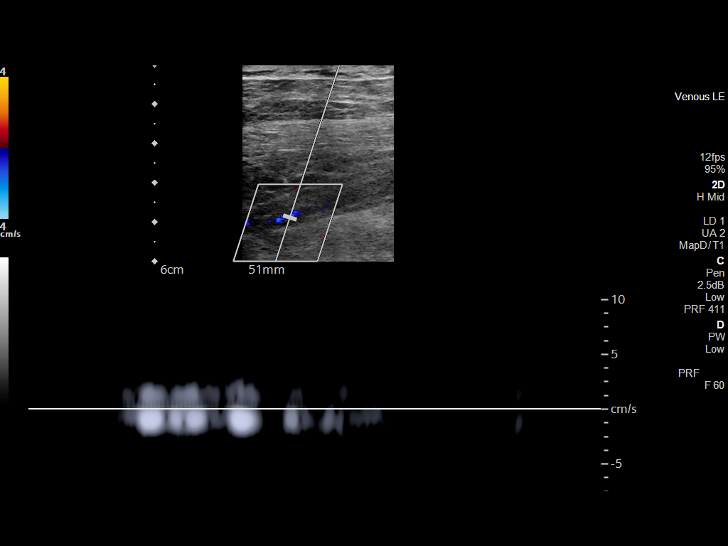
[im 31/59]
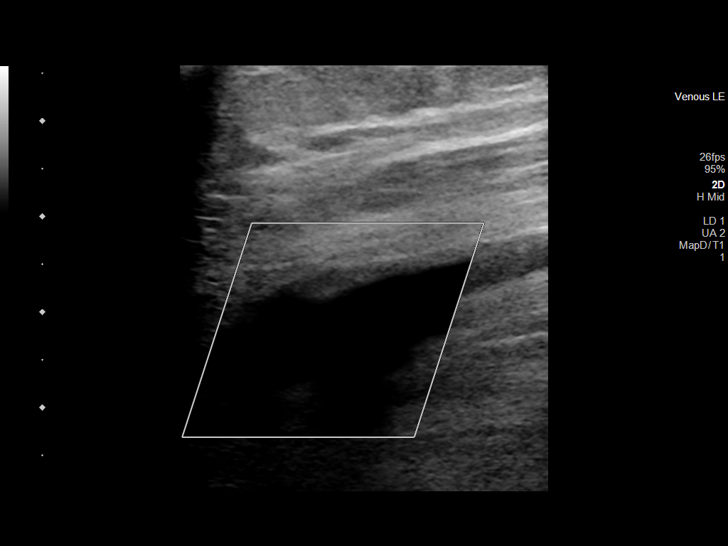
[im 36/59]
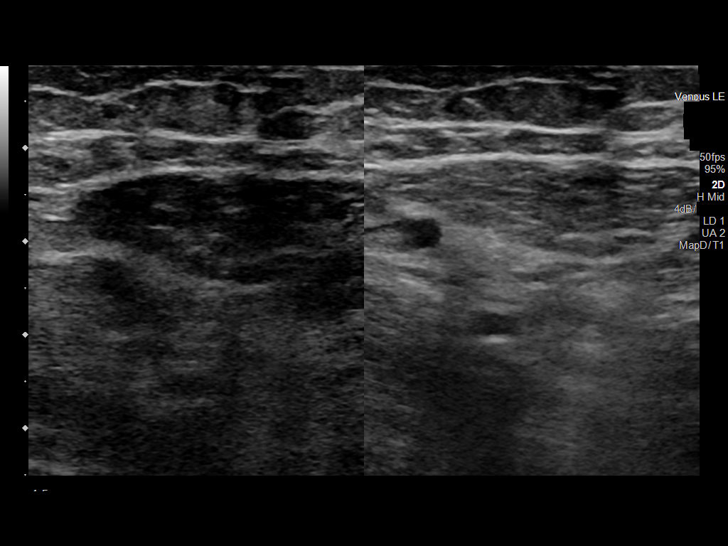
[im 41/59]
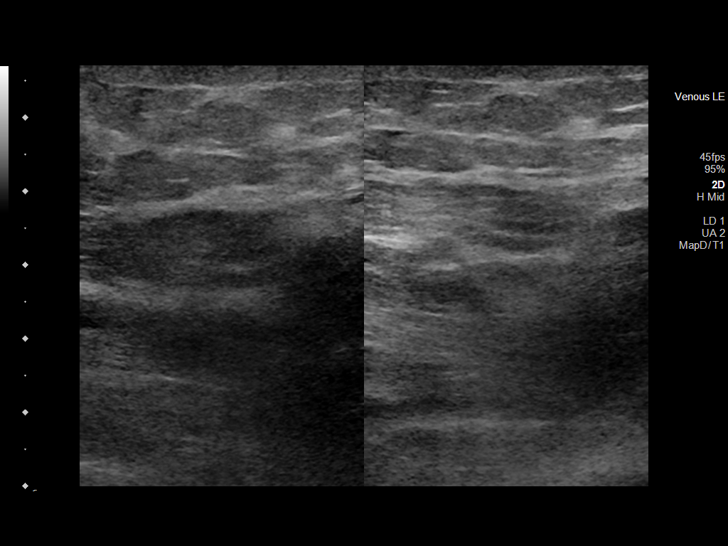
[im 46/59]
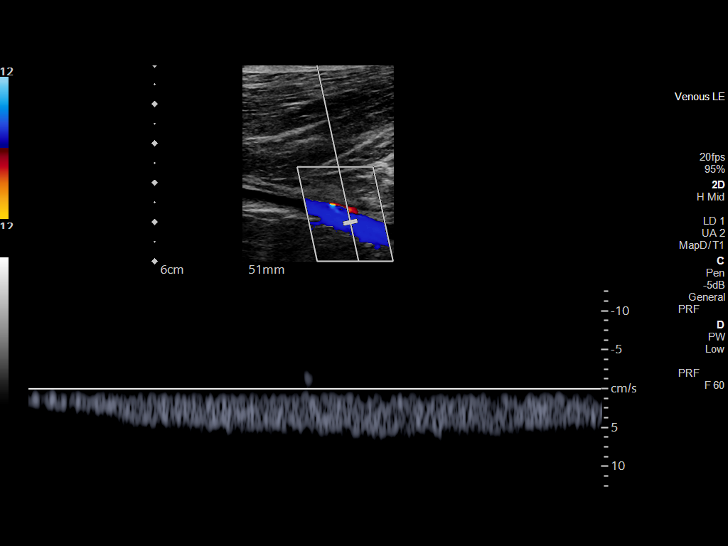
[im 48/59]
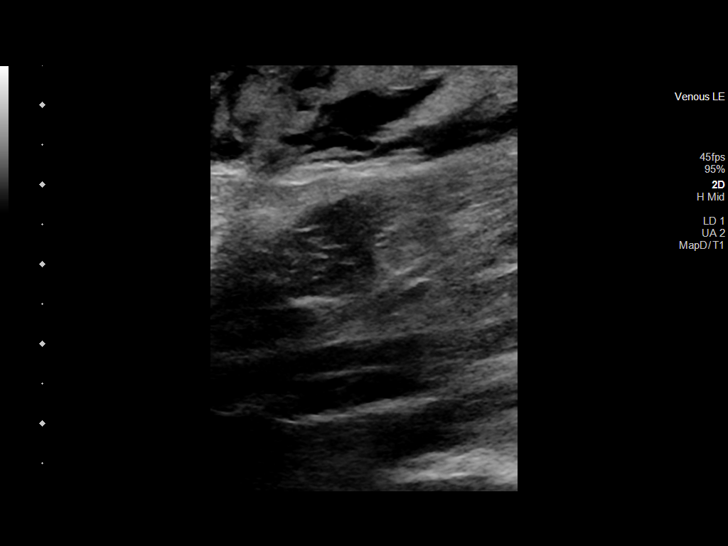
[im 53/59]
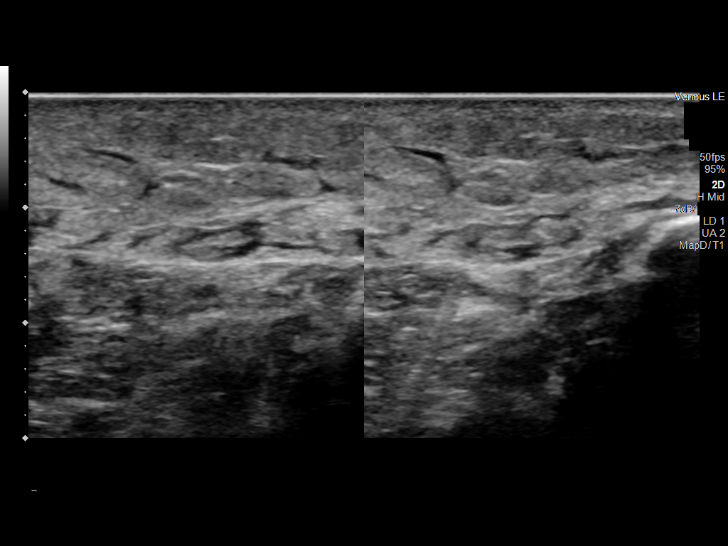
[im 59/59]
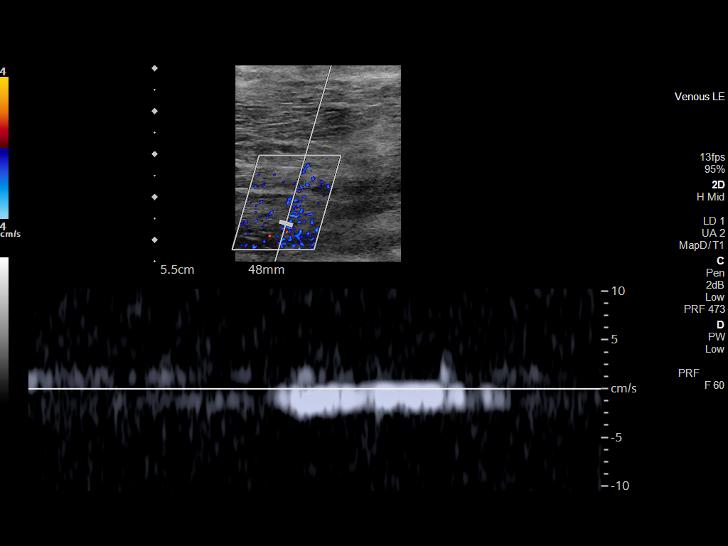

[14 of 24 positions shown; findings below may reference images not displayed]

FINDINGS: VENOUS

Normal compressibility of the common femoral, superficial femoral,
and popliteal veins, as well as the visualized calf veins.
Visualized portions of profunda femoral vein and great saphenous
vein unremarkable. No filling defects to suggest DVT on grayscale or
color Doppler imaging. Doppler waveforms show normal direction of
venous flow, normal respiratory plasticity and response to
augmentation.

Limited views of the contralateral common femoral vein are
unremarkable.

OTHER

Diffuse soft tissue edema

Limitations: none
IMPRESSION: 1. No deep venous thrombosis identified in the bilateral lower
extremities.
2. Diffuse edema in the lower extremity soft tissues

## 2023-11-12 ENCOUNTER — Other Ambulatory Visit: Payer: Self-pay | Admitting: *Deleted

## 2023-11-12 DIAGNOSIS — M3214 Glomerular disease in systemic lupus erythematosus: Secondary | ICD-10-CM

## 2023-11-12 DIAGNOSIS — Z79899 Other long term (current) drug therapy: Secondary | ICD-10-CM

## 2023-11-12 DIAGNOSIS — N033 Chronic nephritic syndrome with diffuse mesangial proliferative glomerulonephritis: Secondary | ICD-10-CM

## 2023-11-12 LAB — CBC WITH DIFFERENTIAL/PLATELET
Absolute Lymphocytes: 1380 {cells}/uL (ref 850–3900)
Absolute Monocytes: 352 {cells}/uL (ref 200–950)
Basophils Absolute: 11 {cells}/uL (ref 0–200)
Basophils Relative: 0.3 %
Eosinophils Absolute: 59 {cells}/uL (ref 15–500)
Eosinophils Relative: 1.6 %
HCT: 33.8 % — ABNORMAL LOW (ref 35.0–45.0)
Hemoglobin: 10.6 g/dL — ABNORMAL LOW (ref 11.7–15.5)
MCH: 27.5 pg (ref 27.0–33.0)
MCHC: 31.4 g/dL — ABNORMAL LOW (ref 32.0–36.0)
MCV: 87.8 fL (ref 80.0–100.0)
MPV: 10.6 fL (ref 7.5–12.5)
Monocytes Relative: 9.5 %
Neutro Abs: 1898 {cells}/uL (ref 1500–7800)
Neutrophils Relative %: 51.3 %
Platelets: 236 10*3/uL (ref 140–400)
RBC: 3.85 10*6/uL (ref 3.80–5.10)
RDW: 12.4 % (ref 11.0–15.0)
Total Lymphocyte: 37.3 %
WBC: 3.7 10*3/uL — ABNORMAL LOW (ref 3.8–10.8)

## 2023-11-13 NOTE — Progress Notes (Signed)
White cell count remains low but better than before.  Hemoglobin is low.  Please advise patient to take multivitamin with iron.

## 2023-12-30 NOTE — Progress Notes (Signed)
Office Visit Note  Patient: Alexis Rice             Date of Birth: 29-Aug-2001           MRN: [id number]             PCP: Verlon Au, MD Referring: Verlon Au, MD Visit Date: 01/13/2024 Occupation: @GUAROCC @  Subjective:  Medication management  History of Present Illness: Alexis Rice is a 23 y.o. 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  Patient states she has not had any flares of lupus.  She denies any history of oral ulcers, nasal ulcers, malar rash, photosensitivity, Raynaud's, lymphadenopathy, chest pain palpitations or shortness of breath.  She has been taking hydroxychloroquine 200 mg p.o. daily, CellCept 250 mg 2 tablets by mouth twice a day.  I do not remember how long it has been she has been off prednisone since 2023.  Uses sun protection and sunscreen.  She is not on any contraception.  She is not sexually active.   Activities of Daily Living:  Patient reports morning stiffness for 0 minutes.   Patient Denies nocturnal pain.  Difficulty dressing/grooming: Denies Difficulty climbing stairs: Denies Difficulty getting out of chair: Denies Difficulty using hands for taps, buttons, cutlery, and/or writing: Denies  Review of Systems  Constitutional: Negative.  Negative for fatigue.  HENT: Negative.  Negative for mouth sores and mouth dryness.   Eyes: Negative.  Negative for dryness.  Respiratory: Negative.  Negative for shortness of breath.   Cardiovascular: Negative.  Negative for chest pain and palpitations.  Gastrointestinal: Negative.  Negative for blood in stool, constipation and diarrhea.  Endocrine: Negative.  Negative for increased urination.  Genitourinary: Negative.  Negative for involuntary urination.  Musculoskeletal: Negative.  Negative for joint pain, gait problem, joint pain, joint swelling, myalgias, muscle weakness, morning stiffness, muscle tenderness and myalgias.  Skin:  Negative.  Negative for color change, rash, hair loss and sensitivity to sunlight.  Allergic/Immunologic: Negative for susceptible to infections.  Neurological:  Positive for headaches. Negative for dizziness.  Hematological: Negative.  Negative for swollen glands.  Psychiatric/Behavioral: Negative.  Negative for depressed mood and sleep disturbance. The patient is not nervous/anxious.     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   Diarrhea 03/27/2022   Anasarca 03/27/2022   Chest pain/palpitation/anxiety 03/27/2022   Palpitation 03/27/2022   Elevated troponin 03/27/2022   Pleural effusion on left 03/27/2022   Anxiety 03/27/2022   Systemic lupus erythematosus (HCC) 03/21/2022   Chronic (diffuse) proliferative glomerulonephritis Class IV, membranous glomerulopathy classV 03/21/2022   High risk medication use 03/21/2022   Family history of lupus erythematosus 03/21/2022   Hx of migraines 03/21/2022   Essential hypertension 02/25/2022   Poor appetite 09/28/2013   Nausea     Past Medical History:  Diagnosis Date   Collagen vascular disease (HCC)    Hypertension    Kidney disease    stage 2 CKD   Lupus    Nausea    Reduced Appetite    Family History  Problem Relation Age of Onset   Ulcers Father    Asthma Sister    Lupus Sister    Cholelithiasis Maternal Aunt    Past Surgical History:  Procedure Laterality Date   RENAL BIOPSY Left    Social History   Social History Narrative   7th grade  [id number]   Immunization History  Administered Date(s) Administered   [employer] Vaccine Bivalent Booster 94yrs & up 01/11/2022     Objective: Vital Signs: BP 120/81   Pulse 92   Resp 16   Ht 5\' 5"  (1.651 m)   Wt 125 lb (56.7 kg)   LMP 12/16/2023 (Approximate)   BMI 20.80 kg/m    Physical Exam Vitals and nursing note reviewed.  Constitutional:      Appearance: She is well-developed.   HENT:     Head: Normocephalic and atraumatic.  Eyes:     Conjunctiva/sclera: Conjunctivae normal.  Cardiovascular:     Rate and Rhythm: Normal rate and regular rhythm.     Heart sounds: Normal heart sounds.  Pulmonary:     Effort: Pulmonary effort is normal.     Breath sounds: Normal breath sounds.  Abdominal:     General: Bowel sounds are normal.     Palpations: Abdomen is soft.  Musculoskeletal:     Cervical back: Normal range of motion.  Lymphadenopathy:     Cervical: No cervical adenopathy.  Skin:    General: Skin is warm and dry.     Capillary Refill: Capillary refill takes less than 2 seconds.  Neurological:     Mental Status: She is alert and oriented to person, place, and time.  Psychiatric:        Behavior: Behavior normal.      Musculoskeletal Exam: Cervical, thoracic and lumbar spine 1 good range of motion.  Patient had no difficulty getting up from the squatting position.  Shoulders, elbows, wrist, MCPs PIPs and DIPs been good range of motion with no synovitis.  Hip joints, knee joints, ankles, MTPs and PIPs were in good range of motion with no synovitis.  No pedal edema is noted.  CDAI Exam: CDAI Score: -- Patient Global: --; Provider Global: -- Swollen: --; Tender: -- Joint Exam 01/13/2024   No joint exam has been documented for this visit   There is currently no information documented on the homunculus. Go to the Rheumatology activity and complete the homunculus joint exam.  Investigation: No additional findings.  Imaging: No results found.  Recent Labs: Lab Results  Component Value Date   WBC 3.7 (L) 11/12/2023   HGB 10.6 (L) 11/12/2023   PLT 236 11/12/2023   NA 141 10/08/2023   K 4.2 10/08/2023   CL 104 10/08/2023   CO2 24 10/08/2023   GLUCOSE 95 10/08/2023   BUN 10 10/08/2023   CREATININE 0.70 10/08/2023   BILITOT <0.2 10/08/2023   ALKPHOS 45 10/08/2023   AST 15 10/08/2023   ALT 5 10/08/2023   PROT 7.1 10/08/2023   ALBUMIN 4.6  10/08/2023   CALCIUM 9.6 10/08/2023   October 08, 2023 protein creatinine ratio 254, C3-C4 normal, dsDNA 11, sed rate 11, vitamin D31.8  Speciality Comments: PLQ Eye Exam 06/16/2023 normal [facility]. f/u 12 months  Procedures:  No procedures performed Allergies: Patient has no known allergies.   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  She has been off prednisone since 2023.  She denies any history of oral ulcers, nasal ulcers, malar rash, photosensitivity, Raynaud's, lymphadenopathy or inflammatory arthritis.  No synovitis was noted on the examination.  Her labs have been stable.  Will recheck labs today.  Plan: Protein /  creatinine ratio, urine, Anti-DNA antibody, double-stranded, C3 and C4, Sedimentation rate  High risk medication use - CellCept 250 mg 2 tablets by mouth twice daily, Plaquenil 200 mg 1 tablet by mouth daily, and Benlysta 200 mg subcutaneous injections once weekly. -Labs obtained on October 08, 2023 white cell count was low at 3.2, hemoglobin 10.9, platelets normal, CMP normal, C3-C4 normal, double-stranded ENA 11, sed rate normal.  Will check labs today and every 3 months.  Plan: CBC with Differential/Platelet, COMPLETE METABOLIC PANEL WITH GFR.  Information on immunization was placed in the AVS.  Chronic (diffuse) proliferative glomerulonephritis - Followed by Dr. Anthony Sar her proteinuria is stable.  Chronic pain of both knees-she had no discomfort in her knee joints today.  She had no difficulty getting up from the squatting position.  Family history of lupus erythematosus-Sister  Essential hypertension-blood pressure was normal at 120/81.  She is on losartan 25 mg p.o. daily.  Vitamin D deficiency-she is currently not taking vitamin D.   She is on vitamin D supplement.  I advised her to take vitamin D 2000 units daily.  Hx of migraines  Orders: Orders Placed This Encounter  Procedures   Protein / creatinine ratio, urine   Anti-DNA antibody, double-stranded   C3 and C4   Sedimentation rate   CBC with Differential/Platelet   COMPLETE METABOLIC PANEL WITH GFR   No orders of the defined types were placed in this encounter.    Follow-Up Instructions: Return in about 3 months (around 04/12/2024) for Systemic lupus.   Pollyann Savoy, MD  Note - This record has been created using Animal nutritionist.  Chart creation errors have been sought, but may not always  have been located. Such creation errors do not reflect on  the standard of medical care.

## 2024-01-13 ENCOUNTER — Encounter: Payer: Self-pay | Admitting: Rheumatology

## 2024-01-13 ENCOUNTER — Ambulatory Visit: Payer: 59 | Attending: Rheumatology | Admitting: Rheumatology

## 2024-01-13 VITALS — BP 120/81 | HR 92 | Resp 16 | Ht 65.0 in | Wt 125.0 lb

## 2024-01-13 DIAGNOSIS — Z840 Family history of diseases of the skin and subcutaneous tissue: Secondary | ICD-10-CM

## 2024-01-13 DIAGNOSIS — M3214 Glomerular disease in systemic lupus erythematosus: Secondary | ICD-10-CM

## 2024-01-13 DIAGNOSIS — M25562 Pain in left knee: Secondary | ICD-10-CM

## 2024-01-13 DIAGNOSIS — Z8669 Personal history of other diseases of the nervous system and sense organs: Secondary | ICD-10-CM

## 2024-01-13 DIAGNOSIS — E559 Vitamin D deficiency, unspecified: Secondary | ICD-10-CM

## 2024-01-13 DIAGNOSIS — M25561 Pain in right knee: Secondary | ICD-10-CM | POA: Diagnosis not present

## 2024-01-13 DIAGNOSIS — N033 Chronic nephritic syndrome with diffuse mesangial proliferative glomerulonephritis: Secondary | ICD-10-CM | POA: Diagnosis not present

## 2024-01-13 DIAGNOSIS — I10 Essential (primary) hypertension: Secondary | ICD-10-CM

## 2024-01-13 DIAGNOSIS — Z79899 Other long term (current) drug therapy: Secondary | ICD-10-CM | POA: Diagnosis not present

## 2024-01-13 DIAGNOSIS — G8929 Other chronic pain: Secondary | ICD-10-CM

## 2024-01-13 NOTE — Patient Instructions (Signed)
Standing Labs We placed an order today for your standing lab work.   Please have your standing labs drawn in April and every 3 monts  Please have your labs drawn 2 weeks prior to your appointment so that the provider can discuss your lab results at your appointment, if possible.  Please note that you may see your imaging and lab results in MyChart before we have reviewed them. We will contact you once all results are reviewed. Please allow our office up to 72 hours to thoroughly review all of the results before contacting the office for clarification of your results.  WALK-IN LAB HOURS  Monday through Thursday from 8:00 am -12:30 pm and 1:00 pm-5:00 pm and Friday from 8:00 am-12:00 pm.  Patients with office visits requiring labs will be seen before walk-in labs.  You may encounter longer than normal wait times. Please allow additional time. Wait times may be shorter on  Monday and Thursday afternoons.  We do not book appointments for walk-in labs. We appreciate your patience and understanding with our staff.   Labs are drawn by [facility]. Please bring your co-pay at the time of your lab draw.  You may receive a bill from [facility] for your lab work.  Please note if you are on Hydroxychloroquine and and an order has been placed for a Hydroxychloroquine level,  you will need to have it drawn 4 hours or more after your last dose.  If you wish to have your labs drawn at another location, please call the office 24 hours in advance so we can fax the orders.  The office is located at [address], [address], [place], [place] [address]   If you have any questions regarding directions or hours of operation,  please call [phone].   As a reminder, please drink plenty of water prior to coming for your lab work. Thanks!   Vaccines You are taking a medication(s) that can suppress your immune system.  The following immunizations are recommended: Flu annually Covid-19  Td/Tdap (tetanus, diphtheria,  pertussis) every 10 years Pneumonia (Prevnar 15 then Pneumovax 23 at least 1 year apart.  Alternatively, can take Prevnar 20 without needing additional dose) Shingrix: 2 doses from 4 weeks to 6 months apart  Please check with your PCP to make sure you are up to date.

## 2024-01-14 LAB — COMPLETE METABOLIC PANEL WITH GFR
AG Ratio: 1.5 (calc) (ref 1.0–2.5)
ALT: 4 U/L — ABNORMAL LOW (ref 6–29)
AST: 12 U/L (ref 10–30)
Albumin: 4.5 g/dL (ref 3.6–5.1)
Alkaline phosphatase (APISO): 38 U/L (ref 31–125)
BUN: 10 mg/dL (ref 7–25)
CO2: 26 mmol/L (ref 20–32)
Calcium: 9.6 mg/dL (ref 8.6–10.2)
Chloride: 105 mmol/L (ref 98–110)
Creat: 0.58 mg/dL (ref 0.50–0.96)
Globulin: 3 g/dL (ref 1.9–3.7)
Glucose, Bld: 77 mg/dL (ref 65–99)
Potassium: 4.1 mmol/L (ref 3.5–5.3)
Sodium: 139 mmol/L (ref 135–146)
Total Bilirubin: 0.3 mg/dL (ref 0.2–1.2)
Total Protein: 7.5 g/dL (ref 6.1–8.1)
eGFR: 131 mL/min/{1.73_m2} (ref >=60)

## 2024-01-14 LAB — CBC WITH DIFFERENTIAL/PLATELET
Absolute Lymphocytes: 1033 {cells}/uL (ref 850–3900)
Absolute Monocytes: 310 {cells}/uL (ref 200–950)
Basophils Absolute: 0 {cells}/uL (ref 0–200)
Basophils Relative: 0 %
Eosinophils Absolute: 90 {cells}/uL (ref 15–500)
Eosinophils Relative: 2.5 %
HCT: 34 % — ABNORMAL LOW (ref 35.0–45.0)
Hemoglobin: 11 g/dL — ABNORMAL LOW (ref 11.7–15.5)
MCH: 28 pg (ref 27.0–33.0)
MCHC: 32.4 g/dL (ref 32.0–36.0)
MCV: 86.5 fL (ref 80.0–100.0)
MPV: 10.5 fL (ref 7.5–12.5)
Monocytes Relative: 8.6 %
Neutro Abs: 2167 {cells}/uL (ref 1500–7800)
Neutrophils Relative %: 60.2 %
Platelets: 249 10*3/uL (ref 140–400)
RBC: 3.93 10*6/uL (ref 3.80–5.10)
RDW: 12.4 % (ref 11.0–15.0)
Total Lymphocyte: 28.7 %
WBC: 3.6 10*3/uL — ABNORMAL LOW (ref 3.8–10.8)

## 2024-01-14 LAB — SEDIMENTATION RATE: Sed Rate: 28 mm/h — ABNORMAL HIGH (ref 0–20)

## 2024-01-14 LAB — ANTI-DNA ANTIBODY, DOUBLE-STRANDED: ds DNA Ab: 9 [IU]/mL — ABNORMAL HIGH

## 2024-01-14 LAB — C3 AND C4
C3 Complement: 133 mg/dL (ref 83–193)
C4 Complement: 34 mg/dL (ref 15–57)

## 2024-01-14 LAB — PROTEIN / CREATININE RATIO, URINE
Creatinine, Urine: 374 mg/dL — ABNORMAL HIGH (ref 20–275)
Protein/Creat Ratio: 136 mg/g{creat} (ref 24–184)
Protein/Creatinine Ratio: 0.136 mg/mg{creat} (ref 0.024–0.184)
Total Protein, Urine: 51 mg/dL — ABNORMAL HIGH (ref 5–24)

## 2024-01-15 NOTE — Progress Notes (Signed)
Urine protein creatinine ratio is normal, sed rate is mildly elevated at, double-stranded DNA is positive and stable titer.  White cell count was low and stable.  Hemoglobin is low and stable.  CMP is normal.  Complements are normal.  No change in treatment advised.  Please forward results to her nephrologist and PCP.

## 2024-03-29 NOTE — Progress Notes (Signed)
 Office Visit Note  Patient: Alexis Rice             Date of Birth: [birth year]           MRN: [id number]             PCP: Jacqulyne Maxim, MD Referring: Jacqulyne Maxim, MD Visit Date: 04/12/2024 Occupation: @GUAROCC @  Subjective:  Medication monitoring   History of Present Illness: Alexis Rice is a 23 y.o. female with history of systemic lupus.  Patient remains on  CellCept  250 mg 2 tablets by mouth twice daily, Plaquenil  200 mg 1 tablet by mouth daily, and Benlysta 200 mg subcutaneous injections once weekly.  She is tolerating combination therapy without any side effects and has not had any recent gaps in therapy.  She denies any signs or symptoms of a systemic lupus flare.  She has not been experiencing any morning stiffness, nocturnal pain, joint swelling, or discomfort.  Her energy level has been stable and she has been sleeping well at night.  Her stress levels have been manageable.  She denies any oral or nasal ulcerations.  She has not had any sicca symptoms.  She denies any recent rashes and has been trying to wear sunscreen on a daily basis.  She denies any symptoms of Raynaud's phenomenon recently.  Patient states that she has had seasonal allergies and has been taking Claritin as needed for symptomatic relief.  She denies any recent infections.  Patient continues to follow-up with nephrology every 6 months.  She had a recent office visit with nephrology on 03/31/2024 at which time she had updated lab work obtained.   Activities of Daily Living:  Patient reports morning stiffness for 0 minutes.   Patient Denies nocturnal pain.  Difficulty dressing/grooming: Denies Difficulty climbing stairs: Denies Difficulty getting out of chair: Denies Difficulty using hands for taps, buttons, cutlery, and/or writing: Denies  Review of Systems  Constitutional:  Negative for fatigue.  HENT:  Negative for mouth sores, mouth dryness and nose dryness.   Eyes:  Negative for pain and  dryness.  Respiratory:  Negative for shortness of breath and difficulty breathing.   Cardiovascular:  Negative for chest pain and palpitations.  Gastrointestinal:  Negative for blood in stool, constipation and diarrhea.  Endocrine: Negative for increased urination.  Genitourinary:  Negative for involuntary urination.  Musculoskeletal:  Negative for joint pain, gait problem, joint pain, joint swelling, myalgias, muscle weakness, morning stiffness, muscle tenderness and myalgias.  Skin:  Negative for color change, rash, hair loss and sensitivity to sunlight.  Allergic/Immunologic: Negative for susceptible to infections.  Neurological:  Negative for dizziness and headaches.  Hematological:  Negative for swollen glands.  Psychiatric/Behavioral:  Negative for depressed mood and sleep disturbance. The patient is not nervous/anxious.     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   Diarrhea 03/27/2022   Anasarca 03/27/2022   Chest pain/palpitation/anxiety 03/27/2022   Palpitation 03/27/2022   Elevated troponin 03/27/2022   Pleural effusion on left 03/27/2022   Anxiety 03/27/2022   Systemic lupus erythematosus (HCC) 03/21/2022   Chronic (diffuse) proliferative glomerulonephritis Class IV, membranous glomerulopathy classV 03/21/2022   High risk medication use 03/21/2022   Family history of lupus erythematosus 03/21/2022   Hx of migraines 03/21/2022   Essential hypertension 02/25/2022   Poor appetite 09/28/2013   Nausea     Past Medical History:  Diagnosis Date   Collagen vascular  disease (HCC)    Hypertension    Kidney disease    stage 2 CKD   Lupus    Nausea    Reduced Appetite    Family History  Problem Relation Age of Onset   Ulcers Father    Asthma Sister    Lupus Sister    Cholelithiasis Maternal Aunt    Past Surgical History:  Procedure Laterality Date   RENAL BIOPSY Left    Social  History   Social History Narrative   7th grade 2014-2015   Immunization History  Administered Date(s) Administered   Pfizer Covid-19 Vaccine Bivalent Booster 67yrs & up 01/11/2022     Objective: Vital Signs: BP 114/80 (BP Location: Left Arm, Patient Position: Sitting, Cuff Size: Normal)   Pulse 93   Resp 14   Ht 5\' 5"  (1.651 m)   Wt 130 lb 12.8 oz (59.3 kg)   BMI 21.77 kg/m    Physical Exam Vitals and nursing note reviewed.  Constitutional:      Appearance: She is well-developed.  HENT:     Head: Normocephalic and atraumatic.  Eyes:     Conjunctiva/sclera: Conjunctivae normal.  Cardiovascular:     Rate and Rhythm: Normal rate and regular rhythm.     Heart sounds: Normal heart sounds.  Pulmonary:     Effort: Pulmonary effort is normal.     Breath sounds: Normal breath sounds.  Abdominal:     General: Bowel sounds are normal.     Palpations: Abdomen is soft.  Musculoskeletal:     Cervical back: Normal range of motion.  Lymphadenopathy:     Cervical: No cervical adenopathy.  Skin:    General: Skin is warm and dry.     Capillary Refill: Capillary refill takes less than 2 seconds.  Neurological:     Mental Status: She is alert and oriented to person, place, and time.  Psychiatric:        Behavior: Behavior normal.      Musculoskeletal Exam: C-spine, thoracic spine, lumbar spine good range of motion.  Shoulder joints, elbow joints, wrist joints, MCPs, PIPs, DIPs have good range of motion with no synovitis.  Complete fist formation bilaterally.  Hip joints have good range of motion with no groin pain.  Knee joints have good range of motion with no warmth or effusion.  Ankle joints have good range of motion with no tenderness or joint swelling.  CDAI Exam: CDAI Score: -- Patient Global: --; Provider Global: -- Swollen: --; Tender: -- Joint Exam 04/12/2024   No joint exam has been documented for this visit   There is currently no information documented on the  homunculus. Go to the Rheumatology activity and complete the homunculus joint exam.  Investigation: No additional findings.  Imaging: No results found.  Recent Labs: Lab Results  Component Value Date   WBC 3.6 (L) 01/13/2024   HGB 11.0 (L) 01/13/2024   PLT 249 01/13/2024   NA 139 01/13/2024   K 4.1 01/13/2024   CL 105 01/13/2024   CO2 26 01/13/2024   GLUCOSE 77 01/13/2024   BUN 10 01/13/2024   CREATININE 0.58 01/13/2024   BILITOT 0.3 01/13/2024   ALKPHOS 45 10/08/2023   AST 12 01/13/2024   ALT 4 (L) 01/13/2024   PROT 7.5 01/13/2024   ALBUMIN  4.6 10/08/2023   CALCIUM 9.6 01/13/2024    Speciality Comments: PLQ Eye Exam 06/16/2023 normal [facility]. f/u 12 months  Procedures:  No procedures performed Allergies: Patient has no  known allergies.   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  She has clinically been doing well taking CellCept  250 mg 2 tablets by mouth twice daily, Plaquenil  200 mg 1 tablet by mouth daily, and Benlysta 200 mg subcutaneous injections once weekly.  She is tolerating combination therapy without any side effects and has not had any recent gaps in therapy.  She has no synovitis on exam.  She has not been experiencing any morning stiffness, nocturnal pain, or joint inflammation.  She has not had any symptoms of Raynaud's phenomenon, recent rashes, hair loss, lymphadenopathy, oral or nasal ulcerations, or sicca symptoms.  Her energy level has been stable and she has been sleeping well at night.  She has been doing well with stress management.   Patient had updated lab work ordered by her nephrologist on 03/31/2024: Urine protein 35.2, urine protein creatinine ratio 288, white blood cell count 4.2, hemoglobin 11.3, platelet count 263K, creatinine 0.66, GFR 127, AST 14,  ALT 7.  plan to obtain the following lab work today for further evaluation. No medication changes will be made at this time.  She was advised to notify us  if she develops signs or symptoms of a flare.  She will follow-up in the office in 3 months or sooner if needed. - Plan: C3 and C4, Sedimentation rate, VITAMIN D  25 Hydroxy (Vit-D Deficiency, Fractures), Anti-DNA antibody, double-stranded  High risk medication use - CellCept  250 mg 2 tablets by mouth twice daily, Plaquenil  200 mg 1 tablet by mouth daily, and Benlysta 200 mg subcutaneous injections once weekly. Tapered off of prednisone  in 2023.  CBC and CMP updated on 01/13/24.   PLQ Eye Exam 06/16/2023 normal [facility]. f/u 12 months.  Patient was encouraged to call to schedule an updated Plaquenil  examination.  She was given a Plaquenil  examination form to take with her to her upcoming appointment.    Chronic (diffuse) proliferative glomerulonephritis - Followed by Dr. Bunnie Carol Singh--proteinuria is stable.  Recently evaluated and had updated lab work: Urine protein was 35.2 and urine protein creatinine ratio was 288 on 03/31/2024.  No medication changes were made at her recent office visit.  Chronic pain of both knees: Good range of motion of both knee joints on examination today.  No warmth or effusion noted.  Other medical conditions are listed as follows:  Family history of lupus erythematosus-Sister  Essential hypertension: Blood pressure was 114/80 today in the office.  Vitamin D  deficiency - Vitamin D  checked today. Plan: VITAMIN D  25 Hydroxy (Vit-D Deficiency, Fractures)  Hx of migraines  Orders: Orders Placed This Encounter  Procedures   C3 and C4   Sedimentation rate   VITAMIN D  25 Hydroxy (Vit-D Deficiency, Fractures)   Anti-DNA antibody, double-stranded   No orders of the defined types were placed in this encounter.  Follow-Up Instructions: Return in about 3 months (around 07/12/2024).   Romayne Clubs, PA-C  Note -  This record has been created using Dragon software.  Chart creation errors have been sought, but may not always  have been located. Such creation errors do not reflect on  the standard of medical care.

## 2024-04-12 ENCOUNTER — Ambulatory Visit: Payer: 59 | Attending: Physician Assistant | Admitting: Physician Assistant

## 2024-04-12 ENCOUNTER — Encounter: Payer: Self-pay | Admitting: Physician Assistant

## 2024-04-12 VITALS — BP 114/80 | HR 93 | Resp 14 | Ht 65.0 in | Wt 130.8 lb

## 2024-04-12 DIAGNOSIS — M25562 Pain in left knee: Secondary | ICD-10-CM

## 2024-04-12 DIAGNOSIS — E559 Vitamin D deficiency, unspecified: Secondary | ICD-10-CM

## 2024-04-12 DIAGNOSIS — Z79899 Other long term (current) drug therapy: Secondary | ICD-10-CM

## 2024-04-12 DIAGNOSIS — N033 Chronic nephritic syndrome with diffuse mesangial proliferative glomerulonephritis: Secondary | ICD-10-CM

## 2024-04-12 DIAGNOSIS — Z8669 Personal history of other diseases of the nervous system and sense organs: Secondary | ICD-10-CM

## 2024-04-12 DIAGNOSIS — M25561 Pain in right knee: Secondary | ICD-10-CM | POA: Diagnosis not present

## 2024-04-12 DIAGNOSIS — Z840 Family history of diseases of the skin and subcutaneous tissue: Secondary | ICD-10-CM

## 2024-04-12 DIAGNOSIS — I10 Essential (primary) hypertension: Secondary | ICD-10-CM

## 2024-04-12 DIAGNOSIS — G8929 Other chronic pain: Secondary | ICD-10-CM

## 2024-04-12 DIAGNOSIS — M3214 Glomerular disease in systemic lupus erythematosus: Secondary | ICD-10-CM | POA: Diagnosis not present

## 2024-04-13 LAB — ANTI-DNA ANTIBODY, DOUBLE-STRANDED: ds DNA Ab: 10 [IU]/mL — ABNORMAL HIGH

## 2024-04-13 LAB — C3 AND C4
C3 Complement: 130 mg/dL (ref 83–193)
C4 Complement: 30 mg/dL (ref 15–57)

## 2024-04-13 LAB — VITAMIN D 25 HYDROXY (VIT D DEFICIENCY, FRACTURES): Vit D, 25-Hydroxy: 76 ng/mL (ref 30–100)

## 2024-04-13 LAB — SEDIMENTATION RATE: Sed Rate: 17 mm/h (ref 0–20)

## 2024-04-13 NOTE — Progress Notes (Signed)
 Vitamin D  WNL ESR WNL

## 2024-04-13 NOTE — Progress Notes (Signed)
Complements WNL

## 2024-04-14 ENCOUNTER — Other Ambulatory Visit: Payer: Self-pay

## 2024-04-14 DIAGNOSIS — Z79899 Other long term (current) drug therapy: Secondary | ICD-10-CM

## 2024-04-14 DIAGNOSIS — M3214 Glomerular disease in systemic lupus erythematosus: Secondary | ICD-10-CM

## 2024-04-14 NOTE — Progress Notes (Signed)
 dsDNA is borderline positive.  Recommend continuing to follow lab work closely every 3 months or sooner if she develops signs or symptoms of a flare.

## 2024-07-28 ENCOUNTER — Ambulatory Visit: Admitting: Rheumatology

## 2024-08-05 NOTE — Progress Notes (Signed)
 Office Visit Note  Patient: Alexis Rice             Date of Birth: 04-28-2001           MRN: [id number]             PCP: Jolee Madelin Patch, MD Referring: Jolee Madelin Patch, MD Visit Date: 08/19/2024 Occupation: @GUAROCC @  Subjective:  Medication management  History of Present Illness: Alexis Rice is a 23 y.o. female with systemic lupus erythematosus.  She returns today after her last visit in April 2025.  She denies any history of morning stiffness, joint pain, joint swelling.  There is no history of oral ulcers, nasal ulcers, malar rash, photosensitivity, Raynaud's or lymphadenopathy.  Patient states she has been seeing her nephrologist every 6 months and labs have been stable.  She continues to be on the combination of CellCept, Plaquenil and Benlysta.  She has recently joined a gym and has been working out.  Patient states she is not sexually active.    Activities of Daily Living:  Patient reports morning stiffness for 0 minute.   Patient Denies nocturnal pain.  Difficulty dressing/grooming: Denies Difficulty climbing stairs: Denies Difficulty getting out of chair: Denies Difficulty using hands for taps, buttons, cutlery, and/or writing: Denies  Review of Systems  Constitutional:  Negative for fatigue.  HENT:  Negative for mouth sores and mouth dryness.   Eyes:  Negative for dryness.  Respiratory:  Negative for shortness of breath.   Cardiovascular:  Negative for chest pain and palpitations.  Gastrointestinal:  Negative for blood in stool, constipation and diarrhea.  Endocrine: Negative for increased urination.  Genitourinary:  Negative for involuntary urination.  Musculoskeletal:  Negative for joint pain, gait problem, joint pain, joint swelling, myalgias, muscle weakness, morning stiffness, muscle tenderness and myalgias.  Skin:  Negative for color change, rash, hair loss and sensitivity to sunlight.  Allergic/Immunologic: Negative for susceptible to infections.   Neurological:  Positive for headaches. Negative for dizziness.  Hematological:  Negative for swollen glands.  Psychiatric/Behavioral:  Negative for depressed mood and sleep disturbance. The patient is not nervous/anxious.     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   Diarrhea 03/27/2022   Anasarca 03/27/2022   Chest pain/palpitation/anxiety 03/27/2022   Palpitation 03/27/2022   Elevated troponin 03/27/2022   Pleural effusion on left 03/27/2022   Anxiety 03/27/2022   Systemic lupus erythematosus (HCC) 03/21/2022   Chronic (diffuse) proliferative glomerulonephritis Class IV, membranous glomerulopathy classV 03/21/2022   High risk medication use 03/21/2022   Family history of lupus erythematosus 03/21/2022   Hx of migraines 03/21/2022   Essential hypertension 02/25/2022   Poor appetite 09/28/2013   Nausea     Past Medical History:  Diagnosis Date   Collagen vascular disease (HCC)    Hypertension    Kidney disease    stage 2 CKD   Lupus    Nausea    Reduced Appetite    Family History  Problem Relation Age of Onset   Ulcers Father    Asthma Sister    Lupus Sister    Cholelithiasis Maternal Aunt    Past Surgical History:  Procedure Laterality Date   RENAL BIOPSY Left    Social History   Social History Narrative   7th grade 2014-2015   Immunization History  Administered Date(s) Administered   Pfizer Covid-19 Vaccine Bivalent Booster 84yrs & up 01/11/2022  Objective: Vital Signs: BP 121/83 (BP Location: Left Arm, Patient Position: Sitting, Cuff Size: Normal)   Pulse 76   Resp 15   Ht 5' 5 (1.651 m)   Wt 131 lb 12.8 oz (59.8 kg)   BMI 21.93 kg/m    Physical Exam Vitals and nursing note reviewed.  Constitutional:      Appearance: She is well-developed.  HENT:     Head: Normocephalic and atraumatic.  Eyes:     Conjunctiva/sclera: Conjunctivae normal.  Cardiovascular:      Rate and Rhythm: Normal rate and regular rhythm.     Heart sounds: Normal heart sounds.  Pulmonary:     Effort: Pulmonary effort is normal.     Breath sounds: Normal breath sounds.  Abdominal:     General: Bowel sounds are normal.     Palpations: Abdomen is soft.  Musculoskeletal:     Cervical back: Normal range of motion.  Lymphadenopathy:     Cervical: No cervical adenopathy.  Skin:    General: Skin is warm and dry.     Capillary Refill: Capillary refill takes less than 2 seconds.     Comments: She had good capillary refill without any nailbed capillary changes.  Neurological:     Mental Status: She is alert and oriented to person, place, and time.  Psychiatric:        Behavior: Behavior normal.      Musculoskeletal Exam: Cervical, thoracic and lumbar spine with good range of motion.  Shoulders, elbows, wrist joints, MCPs, PIPs and DIPs were in good range of motion without any warmth swelling or effusion.  Hip joints and knee joints in good range of motion without any warmth swelling or effusion.  There was no tenderness over her ankles or MTPs.  CDAI Exam: CDAI Score: -- Patient Global: --; Provider Global: -- Swollen: --; Tender: -- Joint Exam 08/19/2024   No joint exam has been documented for this visit   There is currently no information documented on the homunculus. Go to the Rheumatology activity and complete the homunculus joint exam.  Investigation: No additional findings.  Imaging: No results found.  Recent Labs: Lab Results  Component Value Date   WBC 3.6 (L) 01/13/2024   HGB 11.0 (L) 01/13/2024   PLT 249 01/13/2024   NA 139 01/13/2024   K 4.1 01/13/2024   CL 105 01/13/2024   CO2 26 01/13/2024   GLUCOSE 77 01/13/2024   BUN 10 01/13/2024   CREATININE 0.58 01/13/2024   BILITOT 0.3 01/13/2024   ALKPHOS 45 10/08/2023   AST 12 01/13/2024   ALT 4 (L) 01/13/2024   PROT 7.5 01/13/2024   ALBUMIN 4.6 10/08/2023   CALCIUM 9.6 01/13/2024     Speciality Comments: PLQ Eye Exam 06/21/2024 normal [facility]. f/u 12 months  Procedures:  No procedures performed Allergies: Patient has no known allergies.   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  She denies any interruption in the treatment.  She denies history of oral ulcers, nasal ulcers, malar rash, photosensitivity, sicca symptoms, inflammatory arthritis or Raynaud's phenomenon.  April 12, 2024 complements normal, sed rate normal, double-stranded DNA was 10.  Will check labs today.  Plan: Protein / creatinine ratio, urine, Anti-DNA antibody, double-stranded, C3 and C4, Sedimentation rate  High risk medication use - CellCept 250 mg 2 tablets by mouth  twice daily, Plaquenil  200 mg 1 tablet by mouth daily, and Benlysta 200 mg subcutaneous injections once weekly. -Plaquenil  eye examination was June 21, 2024.  Plan: CBC with Differential/Platelet, Comprehensive metabolic panel with GFR today and every 3 months.  Information regarding immunization was placed in the AVS.  Birth control method is abstinence.  Patient was strongly advised that she cannot get pregnant while she is on CellCept .  If she decides to be in a relationship she will have to be on a contraception.  Patient voiced understanding.  Chronic (diffuse) proliferative glomerulonephritis -she is followed by Dr. Ephriam Stank every 6 months.  Patient denies having a flare.  Family history of lupus erythematosus-Sister  Essential hypertension-blood pressure was normal at 121/83.  Vitamin D  deficiency-she has been on vitamin D  and her vitamin D  was 76 on April 12, 2024.  Hx of migraines  Orders: Orders Placed This Encounter  Procedures   Protein / creatinine ratio, urine   CBC with Differential/Platelet   Comprehensive metabolic panel with GFR    Anti-DNA antibody, double-stranded   C3 and C4   Sedimentation rate   No orders of the defined types were placed in this encounter.  .  Follow-Up Instructions: Return in about 3 months (around 11/19/2024) for Systemic lupus.   Alexis Nash, MD  Note - This record has been created using Animal nutritionist.  Chart creation errors have been sought, but may not always  have been located. Such creation errors do not reflect on  the standard of medical care.

## 2024-08-19 ENCOUNTER — Encounter: Payer: Self-pay | Admitting: Rheumatology

## 2024-08-19 ENCOUNTER — Ambulatory Visit: Attending: Rheumatology | Admitting: Rheumatology

## 2024-08-19 VITALS — BP 121/83 | HR 76 | Resp 15 | Ht 65.0 in | Wt 131.8 lb

## 2024-08-19 DIAGNOSIS — Z79899 Other long term (current) drug therapy: Secondary | ICD-10-CM | POA: Diagnosis not present

## 2024-08-19 DIAGNOSIS — M3214 Glomerular disease in systemic lupus erythematosus: Secondary | ICD-10-CM

## 2024-08-19 DIAGNOSIS — I10 Essential (primary) hypertension: Secondary | ICD-10-CM

## 2024-08-19 DIAGNOSIS — Z840 Family history of diseases of the skin and subcutaneous tissue: Secondary | ICD-10-CM

## 2024-08-19 DIAGNOSIS — N033 Chronic nephritic syndrome with diffuse mesangial proliferative glomerulonephritis: Secondary | ICD-10-CM

## 2024-08-19 DIAGNOSIS — E559 Vitamin D deficiency, unspecified: Secondary | ICD-10-CM

## 2024-08-19 DIAGNOSIS — G8929 Other chronic pain: Secondary | ICD-10-CM

## 2024-08-19 DIAGNOSIS — Z8669 Personal history of other diseases of the nervous system and sense organs: Secondary | ICD-10-CM

## 2024-08-19 NOTE — Patient Instructions (Signed)
 Standing Labs We placed an order today for your standing lab work.   Please have your standing labs drawn in November and every 3 months  Please have your labs drawn 2 weeks prior to your appointment so that the provider can discuss your lab results at your appointment, if possible.  Please note that you may see your imaging and lab results in MyChart before we have reviewed them. We will contact you once all results are reviewed. Please allow our office up to 72 hours to thoroughly review all of the results before contacting the office for clarification of your results.  WALK-IN LAB HOURS  Monday through Thursday from 8:00 am -12:30 pm and 1:00 pm-4:30 pm and Friday from 8:00 am-12:00 pm.  Patients with office visits requiring labs will be seen before walk-in labs.  You may encounter longer than normal wait times. Please allow additional time. Wait times may be shorter on  Monday and Thursday afternoons.  We do not book appointments for walk-in labs. We appreciate your patience and understanding with our staff.   Labs are drawn by [facility]. Please bring your co-pay at the time of your lab draw.  You may receive a bill from [facility] for your lab work.  Please note if you are on Hydroxychloroquine and and an order has been placed for a Hydroxychloroquine level,  you will need to have it drawn 4 hours or more after your last dose.  If you wish to have your labs drawn at another location, please call the office 24 hours in advance so we can fax the orders.  The office is located at [address], [address], [place], [place] [address]   If you have any questions regarding directions or hours of operation,  please call [phone].   As a reminder, please drink plenty of water prior to coming for your lab work. Thanks!   Vaccines You are taking a medication(s) that can suppress your immune system.  The following immunizations are recommended: Flu annually Covid-19  Td/Tdap (tetanus,  diphtheria, pertussis) every 10 years Pneumonia (Prevnar 15 then Pneumovax 23  at least 1 year apart.  Alternatively, can take Prevnar 20 without needing additional dose) Shingrix: 2 doses from 4 weeks to 6 months apart  Please check with your PCP to make sure you are up to date.   If you have signs or symptoms of an infection or start antibiotics: First, call your PCP for workup of your infection. Hold your medication through the infection, until you complete your antibiotics, and until symptoms resolve if you take the following: Injectable medication (Actemra, Benlysta, Cimzia, Cosentyx, Enbrel , Humira, Kevzara, Orencia, Remicade, Simponi, Stelara, Taltz, Tremfya) Methotrexate Leflunomide (Arava) Mycophenolate (Cellcept) Earma, Olumiant, or Rinvoq

## 2024-08-20 LAB — COMPREHENSIVE METABOLIC PANEL WITH GFR
AG Ratio: 1.9 (calc) (ref 1.0–2.5)
ALT: 3 U/L — ABNORMAL LOW (ref 6–29)
AST: 12 U/L (ref 10–30)
Albumin: 4.6 g/dL (ref 3.6–5.1)
Alkaline phosphatase (APISO): 31 U/L (ref 31–125)
BUN: 9 mg/dL (ref 7–25)
CO2: 26 mmol/L (ref 20–32)
Calcium: 9.5 mg/dL (ref 8.6–10.2)
Chloride: 105 mmol/L (ref 98–110)
Creat: 0.52 mg/dL (ref 0.50–0.96)
Globulin: 2.4 g/dL (ref 1.9–3.7)
Glucose, Bld: 85 mg/dL (ref 65–99)
Potassium: 3.9 mmol/L (ref 3.5–5.3)
Sodium: 140 mmol/L (ref 135–146)
Total Bilirubin: 0.3 mg/dL (ref 0.2–1.2)
Total Protein: 7 g/dL (ref 6.1–8.1)
eGFR: 134 mL/min/1.73m2 (ref >=60)

## 2024-08-20 LAB — CBC WITH DIFFERENTIAL/PLATELET
Absolute Lymphocytes: 705 {cells}/uL — ABNORMAL LOW (ref 850–3900)
Absolute Monocytes: 307 {cells}/uL (ref 200–950)
Basophils Absolute: 9 {cells}/uL (ref 0–200)
Basophils Relative: 0.3 %
Eosinophils Absolute: 61 {cells}/uL (ref 15–500)
Eosinophils Relative: 2.1 %
HCT: 34.4 % — ABNORMAL LOW (ref 35.0–45.0)
Hemoglobin: 11 g/dL — ABNORMAL LOW (ref 11.7–15.5)
MCH: 28.6 pg (ref 27.0–33.0)
MCHC: 32 g/dL (ref 32.0–36.0)
MCV: 89.4 fL (ref 80.0–100.0)
MPV: 10.3 fL (ref 7.5–12.5)
Monocytes Relative: 10.6 %
Neutro Abs: 1818 {cells}/uL (ref 1500–7800)
Neutrophils Relative %: 62.7 %
Platelets: 219 Thousand/uL (ref 140–400)
RBC: 3.85 Million/uL (ref 3.80–5.10)
RDW: 12.3 % (ref 11.0–15.0)
Total Lymphocyte: 24.3 %
WBC: 2.9 Thousand/uL — ABNORMAL LOW (ref 3.8–10.8)

## 2024-08-20 LAB — SEDIMENTATION RATE: Sed Rate: 11 mm/h (ref 0–20)

## 2024-08-20 LAB — C3 AND C4
C3 Complement: 128 mg/dL (ref 83–193)
C4 Complement: 30 mg/dL (ref 15–57)

## 2024-08-20 LAB — PROTEIN / CREATININE RATIO, URINE
Creatinine, Urine: 66 mg/dL (ref 20–275)
Protein/Creat Ratio: 121 mg/g{creat} (ref 24–184)
Protein/Creatinine Ratio: 0.121 mg/mg{creat} (ref 0.024–0.184)
Total Protein, Urine: 8 mg/dL (ref 5–24)

## 2024-08-20 LAB — ANTI-DNA ANTIBODY, DOUBLE-STRANDED: ds DNA Ab: 8 [IU]/mL — ABNORMAL HIGH

## 2024-08-23 ENCOUNTER — Ambulatory Visit: Payer: Self-pay | Admitting: Physician Assistant

## 2024-09-23 ENCOUNTER — Other Ambulatory Visit: Payer: Self-pay | Admitting: *Deleted

## 2024-09-23 DIAGNOSIS — M3214 Glomerular disease in systemic lupus erythematosus: Secondary | ICD-10-CM

## 2024-09-23 DIAGNOSIS — Z79899 Other long term (current) drug therapy: Secondary | ICD-10-CM

## 2024-09-24 LAB — CBC WITH DIFFERENTIAL/PLATELET
Absolute Lymphocytes: 840 {cells}/uL — ABNORMAL LOW (ref 850–3900)
Absolute Monocytes: 319 {cells}/uL (ref 200–950)
Basophils Absolute: 20 {cells}/uL (ref 0–200)
Basophils Relative: 0.7 %
Eosinophils Absolute: 160 {cells}/uL (ref 15–500)
Eosinophils Relative: 5.7 %
HCT: 33.9 % — ABNORMAL LOW (ref 35.0–45.0)
Hemoglobin: 10.8 g/dL — ABNORMAL LOW (ref 11.7–15.5)
MCH: 28.1 pg (ref 27.0–33.0)
MCHC: 31.9 g/dL — ABNORMAL LOW (ref 32.0–36.0)
MCV: 88.3 fL (ref 80.0–100.0)
MPV: 10.1 fL (ref 7.5–12.5)
Monocytes Relative: 11.4 %
Neutro Abs: 1462 {cells}/uL — ABNORMAL LOW (ref 1500–7800)
Neutrophils Relative %: 52.2 %
Platelets: 224 Thousand/uL (ref 140–400)
RBC: 3.84 Million/uL (ref 3.80–5.10)
RDW: 12 % (ref 11.0–15.0)
Total Lymphocyte: 30 %
WBC: 2.8 Thousand/uL — ABNORMAL LOW (ref 3.8–10.8)

## 2024-09-24 LAB — COMPLETE METABOLIC PANEL WITHOUT GFR
AG Ratio: 1.8 (calc) (ref 1.0–2.5)
ALT: 5 U/L — ABNORMAL LOW (ref 6–29)
AST: 10 U/L (ref 10–30)
Albumin: 4.6 g/dL (ref 3.6–5.1)
Alkaline phosphatase (APISO): 35 U/L (ref 31–125)
BUN: 10 mg/dL (ref 7–25)
CO2: 29 mmol/L (ref 20–32)
Calcium: 9.4 mg/dL (ref 8.6–10.2)
Chloride: 105 mmol/L (ref 98–110)
Creat: 0.61 mg/dL (ref 0.50–0.96)
Globulin: 2.5 g/dL (ref 1.9–3.7)
Glucose, Bld: 94 mg/dL (ref 65–99)
Potassium: 3.8 mmol/L (ref 3.5–5.3)
Sodium: 141 mmol/L (ref 135–146)
Total Bilirubin: 0.3 mg/dL (ref 0.2–1.2)
Total Protein: 7.1 g/dL (ref 6.1–8.1)

## 2024-09-24 LAB — SEDIMENTATION RATE: Sed Rate: 11 mm/h (ref 0–20)

## 2024-09-24 LAB — PROTEIN / CREATININE RATIO, URINE
Creatinine, Urine: 437 mg/dL — ABNORMAL HIGH (ref 20–275)
Protein/Creat Ratio: 85 mg/g{creat} (ref 24–184)
Protein/Creatinine Ratio: 0.085 mg/mg{creat} (ref 0.024–0.184)
Total Protein, Urine: 37 mg/dL — ABNORMAL HIGH (ref 5–24)

## 2024-09-24 LAB — C3 AND C4
C3 Complement: 125 mg/dL (ref 83–193)
C4 Complement: 28 mg/dL (ref 15–57)

## 2024-09-24 LAB — ANTI-DNA ANTIBODY, DOUBLE-STRANDED: ds DNA Ab: 8 [IU]/mL — ABNORMAL HIGH

## 2024-09-27 ENCOUNTER — Ambulatory Visit: Payer: Self-pay | Admitting: Physician Assistant

## 2024-09-27 NOTE — Progress Notes (Signed)
 Reviewed lab work with Dr. Dolphus.   Please forward results to nephrologist.   Total urine protein is slightly elevated but urine protein creatinine ratio is WNL.   ALT is borderline low, rest of CMP WNL.   dsDNA remains within indeterminate range-8.   Complements WNL ESR WNL WBC count low-2.8 but stable.  Hemoglobin and hematocrit are low but stable.  Absolute neutrophils and lymphocytes are low but stable. We will continue to monitor closely.

## 2024-11-09 NOTE — Progress Notes (Unsigned)
 Office Visit Note  Patient: Alexis Rice             Date of Birth: [birth year]           MRN: [id number]             PCP: Jolee Madelin Patch, MD Referring: Jolee Madelin Patch, MD Visit Date: 11/23/2024 Occupation: Data Unavailable  Subjective:  Medication monitoring  History of Present Illness: Alexis Rice is a 23 y.o. female with history of systemic lupus erythematosus.  Patient remains on CellCept  250 mg 1 tablet by mouth twice daily, Plaquenil  200 mg 1 tablet by mouth daily, and Benlysta 200 mg sq injections once weekly.  Patient reports that in October her nephrologist recommended reducing the dose of CellCept  to 250 mg 1 tablet twice daily due to low white blood cell count.  She had lab work rechecked on 11/08/2024 and her white blood cell count has started to improve but she will be having another recheck on 12/12/2024.  She has not noticed any new or worsening symptoms since reducing the dose of CellCept . She denies any signs or symptoms of a systemic lupus flare.  She denies any joint pain or joint swelling.  She denies any recent rashes or hair loss.  She denies any symptoms of Raynaud's phenomenon.  She denies any oral or nasal ulcerations.  She denies any sicca symptoms. She denies any recent or recurrent infections.  She denies any medical conditions.   Activities of Daily Living:  Patient reports morning stiffness for 0 minute.   Patient Denies nocturnal pain.  Difficulty dressing/grooming: Denies Difficulty climbing stairs: Denies Difficulty getting out of chair: Denies Difficulty using hands for taps, buttons, cutlery, and/or writing: Denies  Review of Systems  Constitutional:  Negative for fatigue.  HENT:  Negative for mouth sores and mouth dryness.   Eyes:  Negative for dryness.  Respiratory:  Negative for shortness of breath.   Cardiovascular:  Negative for chest pain and palpitations.  Gastrointestinal:  Negative for blood in stool, constipation and diarrhea.   Endocrine: Negative for increased urination.  Genitourinary:  Negative for involuntary urination.  Musculoskeletal:  Negative for joint pain, gait problem, joint pain, joint swelling, myalgias, muscle weakness, morning stiffness, muscle tenderness and myalgias.  Skin:  Negative for color change, rash, hair loss and sensitivity to sunlight.  Allergic/Immunologic: Negative for susceptible to infections.  Neurological:  Positive for headaches. Negative for dizziness.  Hematological:  Negative for swollen glands.  Psychiatric/Behavioral:  Negative for depressed mood and sleep disturbance. The patient is not nervous/anxious.     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   Diarrhea 03/27/2022   Anasarca 03/27/2022   Chest pain/palpitation/anxiety 03/27/2022   Palpitation 03/27/2022   Elevated troponin 03/27/2022   Pleural effusion on left 03/27/2022   Anxiety 03/27/2022   Systemic lupus erythematosus (HCC) 03/21/2022   Chronic (diffuse) proliferative glomerulonephritis Class IV, membranous glomerulopathy classV 03/21/2022   High risk medication use 03/21/2022   Family history of lupus erythematosus 03/21/2022   Hx of migraines 03/21/2022   Essential hypertension 02/25/2022   Poor appetite 09/28/2013   Nausea     Past Medical History:  Diagnosis Date   Collagen vascular disease    Hypertension    Kidney disease    stage 2 CKD   Lupus    Nausea    Reduced Appetite    Family History  Problem Relation  Age of Onset   Ulcers Father    Asthma Sister    Lupus Sister    Cholelithiasis Maternal Aunt    Past Surgical History:  Procedure Laterality Date   RENAL BIOPSY Left    Social History   Tobacco Use   Smoking status: Former    Current packs/day: 0.00    Average packs/day: 0.1 packs/day for 0.4 years    Types: Cigarettes    Start date: 07/2020    Quit date: 2022    Years since quitting: 3.9     Passive exposure: Past   Smokeless tobacco: Never   Tobacco comments:    Smoked for 4 months  Vaping Use   Vaping status: Never Used  Substance Use Topics   Alcohol use: No   Drug use: Never   Social History   Social History Narrative   7th grade 2014-2015     Immunization History  Administered Date(s) Administered   Pfizer Covid-19 Vaccine Bivalent Booster 85yrs & up 01/11/2022     Objective: Vital Signs: BP 113/76   Pulse 80   Temp 98 F (36.7 C)   Resp 14   Ht 5' 5 (1.651 m)   Wt 132 lb 9.6 oz (60.1 kg)   LMP 11/09/2024   BMI 22.07 kg/m    Physical Exam Vitals and nursing note reviewed.  Constitutional:      Appearance: She is well-developed.  HENT:     Head: Normocephalic and atraumatic.  Eyes:     Conjunctiva/sclera: Conjunctivae normal.  Cardiovascular:     Rate and Rhythm: Normal rate and regular rhythm.     Heart sounds: Normal heart sounds.  Pulmonary:     Effort: Pulmonary effort is normal.     Breath sounds: Normal breath sounds.  Abdominal:     General: Bowel sounds are normal.     Palpations: Abdomen is soft.  Musculoskeletal:     Cervical back: Normal range of motion.  Lymphadenopathy:     Cervical: No cervical adenopathy.  Skin:    General: Skin is warm and dry.     Capillary Refill: Capillary refill takes less than 2 seconds.  Neurological:     Mental Status: She is alert and oriented to person, place, and time.  Psychiatric:        Behavior: Behavior normal.      Musculoskeletal Exam: C-spine, thoracic spine, lumbar spine have good range of motion.  No midline spinal tenderness.  No SI joint tenderness.  Shoulder joints, elbow joints, wrist joints, MCPs, PIPs, DIPs have good range of motion with no synovitis.  Complete fist formation bilaterally.  Hip joints have good range of motion with no groin pain.  Knee joints have good range of motion no warmth or effusion.  Ankle joints have good range of motion no tenderness or joint  swelling.  No evidence of Achilles tendinitis or plantar fasciitis.   CDAI Exam: CDAI Score: -- Patient Global: --; Provider Global: -- Swollen: --; Tender: -- Joint Exam 11/23/2024   No joint exam has been documented for this visit   There is currently no information documented on the homunculus. Go to the Rheumatology activity and complete the homunculus joint exam.  Investigation: No additional findings.  Imaging: No results found.  Recent Labs: Lab Results  Component Value Date   WBC 2.8 (L) 09/23/2024   HGB 10.8 (L) 09/23/2024   PLT 224 09/23/2024   NA 141 09/23/2024   K 3.8 09/23/2024   CL 105  09/23/2024   CO2 29 09/23/2024   GLUCOSE 94 09/23/2024   BUN 10 09/23/2024   CREATININE 0.61 09/23/2024   BILITOT 0.3 09/23/2024   ALKPHOS 45 10/08/2023   AST 10 09/23/2024   ALT 5 (L) 09/23/2024   PROT 7.1 09/23/2024   ALBUMIN  4.6 10/08/2023   CALCIUM 9.4 09/23/2024    Speciality Comments: PLQ Eye Exam 06/21/2024 normal [facility]. f/u 12 months  Procedures:  No procedures performed Allergies: Patient has no known allergies.    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  She has clinically been doing well taking Plaquenil  200 mg 1 tablet daily, Benlysta 200 mg sq injections once weekly, and CellCept  250 mg 1 tablet twice daily.  The dose of CellCept  was reduced in October by nephrology due to neutropenia and lymphopenia.  She has not noticed any new or worsening symptoms on the reduced dose of CellCept  and had CBC rechecked on 11/08/2024-WBC count improved to 3.0.  She will be having lab work repeated again on 12/12/2024. Lab work from 09/23/24 was reviewed today in the office: Double-stranded DNA 8, complements within normal limits, ESR within normal limits.  Plan to update lab  work in January 2026--Future orders will be placed today. No synovitis was noted on examination today.  No recent rashes or hair loss.  No symptoms of Raynaud's phenomenon.  No oral or nasal ulcerations.  No sicca symptoms.  Lungs are clear to auscultation today. No medication changes will be made at this time.  Patient was advised to notify us  if she develops any signs or symptoms of a flare.  She will follow-up in the office in 3 months or sooner if needed.  High risk medication use - CellCept  250 mg 1 tablet by mouth twice daily, Plaquenil  200 mg 1 tablet by mouth daily, Benlysta 200 mg sq injections once weekly. CBC and CMP were drawn on 11/08/24.   PLQ Eye Exam 06/21/2024 normal [facility] f/u 12 months.  No recent or recurrent infections. No new medical conditions.   Chronic (diffuse) proliferative glomerulonephritis -She is followed by Dr. Ephriam Stank every 6 months. Advised by Dr. Stank to reduce the dose of Cellcept  to 250 mg 1 tablet twice daily due to neutropenia and lymphopenia.   Family history of lupus erythematosus-Sister  Other medical conditions are listed as follows:   Essential hypertension: BP 113/76 today in the office.   Vitamin D  deficiency  Hx of migraines    Orders: Orders Placed This Encounter  Procedures   Protein / creatinine ratio, urine   CBC with Differential/Platelet   Comprehensive metabolic panel with GFR   Sedimentation rate   C3 and C4   Anti-DNA antibody, double-stranded   No orders of the defined types were placed in this encounter.    Follow-Up Instructions: Return in about 3 months (around 02/21/2025) for Systemic lupus erythematosus.   Waddell CHRISTELLA Craze, PA-C  Note - This record has been created using Dragon software.  Chart creation errors have been sought, but may not always  have been located. Such creation errors do not reflect on  the standard of medical care.

## 2024-11-23 ENCOUNTER — Telehealth: Payer: Self-pay

## 2024-11-23 ENCOUNTER — Encounter: Payer: Self-pay | Admitting: Physician Assistant

## 2024-11-23 ENCOUNTER — Ambulatory Visit: Attending: Physician Assistant | Admitting: Physician Assistant

## 2024-11-23 VITALS — BP 113/76 | HR 80 | Temp 98.0°F | Resp 14 | Ht 65.0 in | Wt 132.6 lb

## 2024-11-23 DIAGNOSIS — Z840 Family history of diseases of the skin and subcutaneous tissue: Secondary | ICD-10-CM | POA: Diagnosis not present

## 2024-11-23 DIAGNOSIS — Z8669 Personal history of other diseases of the nervous system and sense organs: Secondary | ICD-10-CM

## 2024-11-23 DIAGNOSIS — M25461 Effusion, right knee: Secondary | ICD-10-CM

## 2024-11-23 DIAGNOSIS — E559 Vitamin D deficiency, unspecified: Secondary | ICD-10-CM

## 2024-11-23 DIAGNOSIS — I10 Essential (primary) hypertension: Secondary | ICD-10-CM

## 2024-11-23 DIAGNOSIS — M3214 Glomerular disease in systemic lupus erythematosus: Secondary | ICD-10-CM | POA: Diagnosis not present

## 2024-11-23 DIAGNOSIS — N033 Chronic nephritic syndrome with diffuse mesangial proliferative glomerulonephritis: Secondary | ICD-10-CM | POA: Diagnosis not present

## 2024-11-23 DIAGNOSIS — M25562 Pain in left knee: Secondary | ICD-10-CM

## 2024-11-23 DIAGNOSIS — G8929 Other chronic pain: Secondary | ICD-10-CM

## 2024-11-23 DIAGNOSIS — Z79899 Other long term (current) drug therapy: Secondary | ICD-10-CM

## 2024-11-23 DIAGNOSIS — M25522 Pain in left elbow: Secondary | ICD-10-CM

## 2024-11-23 DIAGNOSIS — M25561 Pain in right knee: Secondary | ICD-10-CM

## 2024-11-23 DIAGNOSIS — M25521 Pain in right elbow: Secondary | ICD-10-CM

## 2024-11-23 NOTE — Telephone Encounter (Signed)
 Opened in error

## 2025-02-22 ENCOUNTER — Ambulatory Visit: Admitting: Physician Assistant

## 2025-03-07 ENCOUNTER — Ambulatory Visit: Admitting: Physician Assistant
